# Patient Record
Sex: Female | Born: 1955 | Race: White | Hispanic: No | Marital: Married | Smoking: Never smoker
Health system: Southern US, Community
[De-identification: ages and names within clinical notes are randomized; demographics above are authoritative.]

## PROBLEM LIST (undated history)

## (undated) DIAGNOSIS — Z86018 Personal history of other benign neoplasm: Secondary | ICD-10-CM

## (undated) DIAGNOSIS — J40 Bronchitis, not specified as acute or chronic: Secondary | ICD-10-CM

## (undated) DIAGNOSIS — S92909A Unspecified fracture of unspecified foot, initial encounter for closed fracture: Secondary | ICD-10-CM

## (undated) DIAGNOSIS — M810 Age-related osteoporosis without current pathological fracture: Secondary | ICD-10-CM

## (undated) DIAGNOSIS — K829 Disease of gallbladder, unspecified: Secondary | ICD-10-CM

## (undated) DIAGNOSIS — R87610 Atypical squamous cells of undetermined significance on cytologic smear of cervix (ASC-US): Secondary | ICD-10-CM

## (undated) DIAGNOSIS — R1011 Right upper quadrant pain: Secondary | ICD-10-CM

## (undated) DIAGNOSIS — G4730 Sleep apnea, unspecified: Secondary | ICD-10-CM

## (undated) DIAGNOSIS — G4733 Obstructive sleep apnea (adult) (pediatric): Secondary | ICD-10-CM

## (undated) DIAGNOSIS — M26609 Unspecified temporomandibular joint disorder, unspecified side: Secondary | ICD-10-CM

## (undated) DIAGNOSIS — O24419 Gestational diabetes mellitus in pregnancy, unspecified control: Secondary | ICD-10-CM

## (undated) DIAGNOSIS — C4491 Basal cell carcinoma of skin, unspecified: Secondary | ICD-10-CM

## (undated) DIAGNOSIS — N6019 Diffuse cystic mastopathy of unspecified breast: Secondary | ICD-10-CM

## (undated) DIAGNOSIS — M2550 Pain in unspecified joint: Secondary | ICD-10-CM

## (undated) DIAGNOSIS — F419 Anxiety disorder, unspecified: Secondary | ICD-10-CM

## (undated) DIAGNOSIS — J309 Allergic rhinitis, unspecified: Secondary | ICD-10-CM

## (undated) DIAGNOSIS — T7840XA Allergy, unspecified, initial encounter: Secondary | ICD-10-CM

## (undated) HISTORY — DX: Allergic rhinitis, unspecified: J30.9

## (undated) HISTORY — PX: LIPOMA EXCISION: SHX5283

## (undated) HISTORY — DX: Obstructive sleep apnea (adult) (pediatric): G47.33

## (undated) HISTORY — DX: Basal cell carcinoma of skin, unspecified: C44.91

## (undated) HISTORY — DX: Personal history of other benign neoplasm: Z86.018

## (undated) HISTORY — DX: Sleep apnea, unspecified: G47.30

## (undated) HISTORY — DX: Allergy, unspecified, initial encounter: T78.40XA

## (undated) HISTORY — PX: OTHER SURGICAL HISTORY: SHX169

## (undated) HISTORY — DX: Diffuse cystic mastopathy of unspecified breast: N60.19

## (undated) HISTORY — DX: Disease of gallbladder, unspecified: K82.9

## (undated) HISTORY — DX: Unspecified fracture of unspecified foot, initial encounter for closed fracture: S92.909A

## (undated) HISTORY — DX: Gestational diabetes mellitus in pregnancy, unspecified control: O24.419

## (undated) HISTORY — DX: Anxiety disorder, unspecified: F41.9

## (undated) HISTORY — DX: Unspecified temporomandibular joint disorder, unspecified side: M26.609

## (undated) HISTORY — PX: BREAST CYST ASPIRATION: SHX578

## (undated) HISTORY — DX: Age-related osteoporosis without current pathological fracture: M81.0

## (undated) HISTORY — DX: Atypical squamous cells of undetermined significance on cytologic smear of cervix (ASC-US): R87.610

---

## 1994-01-18 HISTORY — PX: OTHER SURGICAL HISTORY: SHX169

## 2003-10-21 ENCOUNTER — Ambulatory Visit: Payer: Self-pay | Admitting: Obstetrics and Gynecology

## 2004-04-08 ENCOUNTER — Ambulatory Visit: Payer: Self-pay | Admitting: Obstetrics and Gynecology

## 2004-04-27 ENCOUNTER — Other Ambulatory Visit: Admission: RE | Admit: 2004-04-27 | Discharge: 2004-04-27 | Payer: Self-pay | Admitting: Diagnostic Radiology

## 2004-04-27 ENCOUNTER — Encounter (INDEPENDENT_AMBULATORY_CARE_PROVIDER_SITE_OTHER): Payer: Self-pay | Admitting: *Deleted

## 2004-04-27 ENCOUNTER — Encounter: Admission: RE | Admit: 2004-04-27 | Discharge: 2004-04-27 | Payer: Self-pay | Admitting: *Deleted

## 2004-07-14 ENCOUNTER — Ambulatory Visit: Payer: Self-pay | Admitting: Family Medicine

## 2004-07-16 ENCOUNTER — Ambulatory Visit: Payer: Self-pay | Admitting: Family Medicine

## 2004-09-08 ENCOUNTER — Ambulatory Visit: Payer: Self-pay | Admitting: Family Medicine

## 2004-09-08 ENCOUNTER — Other Ambulatory Visit: Admission: RE | Admit: 2004-09-08 | Discharge: 2004-09-08 | Payer: Self-pay | Admitting: Family Medicine

## 2004-09-09 ENCOUNTER — Encounter: Payer: Self-pay | Admitting: Family Medicine

## 2005-05-31 ENCOUNTER — Ambulatory Visit: Payer: Self-pay | Admitting: Family Medicine

## 2005-06-07 ENCOUNTER — Ambulatory Visit: Payer: Self-pay | Admitting: Family Medicine

## 2005-06-08 ENCOUNTER — Ambulatory Visit: Payer: Self-pay | Admitting: Family Medicine

## 2005-11-30 ENCOUNTER — Ambulatory Visit: Payer: Self-pay | Admitting: Family Medicine

## 2006-05-03 ENCOUNTER — Encounter: Payer: Self-pay | Admitting: Family Medicine

## 2006-06-13 ENCOUNTER — Ambulatory Visit: Payer: Self-pay | Admitting: Obstetrics and Gynecology

## 2006-12-22 ENCOUNTER — Encounter: Payer: Self-pay | Admitting: Family Medicine

## 2006-12-22 DIAGNOSIS — N6019 Diffuse cystic mastopathy of unspecified breast: Secondary | ICD-10-CM | POA: Insufficient documentation

## 2006-12-22 DIAGNOSIS — J309 Allergic rhinitis, unspecified: Secondary | ICD-10-CM | POA: Insufficient documentation

## 2006-12-30 ENCOUNTER — Encounter: Payer: Self-pay | Admitting: Family Medicine

## 2006-12-30 ENCOUNTER — Other Ambulatory Visit: Admission: RE | Admit: 2006-12-30 | Discharge: 2006-12-30 | Payer: Self-pay | Admitting: Family Medicine

## 2006-12-30 ENCOUNTER — Ambulatory Visit: Payer: Self-pay | Admitting: Family Medicine

## 2006-12-31 ENCOUNTER — Encounter: Payer: Self-pay | Admitting: Family Medicine

## 2007-01-02 LAB — CONVERTED CEMR LAB
ALT: 12 units/L (ref 0–35)
AST: 13 units/L (ref 0–37)
Basophils Absolute: 0.1 10*3/uL (ref 0.0–0.1)
Basophils Relative: 1 % (ref 0–1)
Bilirubin, Direct: <0.1 mg/dL (ref 0.0–0.3)
CO2: 26 meq/L (ref 19–32)
Chloride: 104 meq/L (ref 96–112)
Glucose, Bld: 92 mg/dL (ref 70–99)
Lymphocytes Relative: 29 % (ref 12–46)
MCHC: 32.7 g/dL (ref 30.0–36.0)
Neutro Abs: 3 10*3/uL (ref 1.7–7.7)
Neutrophils Relative %: 60 % (ref 43–77)
RDW: 12.6 % (ref 11.5–15.5)
Sodium: 140 meq/L (ref 135–145)
TSH: 1.554 microintl units/mL (ref 0.350–5.50)
Total Protein: 7.3 g/dL (ref 6.0–8.3)

## 2007-01-03 ENCOUNTER — Encounter: Payer: Self-pay | Admitting: Family Medicine

## 2007-01-03 ENCOUNTER — Ambulatory Visit: Payer: Self-pay | Admitting: Family Medicine

## 2007-01-10 ENCOUNTER — Encounter (INDEPENDENT_AMBULATORY_CARE_PROVIDER_SITE_OTHER): Payer: Self-pay | Admitting: *Deleted

## 2007-01-10 LAB — CONVERTED CEMR LAB: Pap Smear: NORMAL

## 2007-05-10 ENCOUNTER — Ambulatory Visit: Payer: Self-pay | Admitting: Family Medicine

## 2007-05-10 DIAGNOSIS — N926 Irregular menstruation, unspecified: Secondary | ICD-10-CM | POA: Insufficient documentation

## 2007-05-16 ENCOUNTER — Encounter: Admission: RE | Admit: 2007-05-16 | Discharge: 2007-05-16 | Payer: Self-pay | Admitting: Family Medicine

## 2007-06-14 ENCOUNTER — Encounter: Payer: Self-pay | Admitting: Family Medicine

## 2007-06-14 ENCOUNTER — Ambulatory Visit: Payer: Self-pay | Admitting: Family Medicine

## 2007-06-19 ENCOUNTER — Encounter (INDEPENDENT_AMBULATORY_CARE_PROVIDER_SITE_OTHER): Payer: Self-pay | Admitting: *Deleted

## 2007-12-05 ENCOUNTER — Encounter: Payer: Self-pay | Admitting: Family Medicine

## 2008-02-21 ENCOUNTER — Ambulatory Visit: Payer: Self-pay | Admitting: Family Medicine

## 2008-02-21 ENCOUNTER — Encounter: Payer: Self-pay | Admitting: Family Medicine

## 2008-02-21 ENCOUNTER — Other Ambulatory Visit: Admission: RE | Admit: 2008-02-21 | Discharge: 2008-02-21 | Payer: Self-pay | Admitting: Family Medicine

## 2008-02-26 ENCOUNTER — Encounter (INDEPENDENT_AMBULATORY_CARE_PROVIDER_SITE_OTHER): Payer: Self-pay | Admitting: *Deleted

## 2008-03-05 ENCOUNTER — Ambulatory Visit: Payer: Self-pay | Admitting: Internal Medicine

## 2008-04-08 ENCOUNTER — Ambulatory Visit: Payer: Self-pay | Admitting: Internal Medicine

## 2008-04-08 HISTORY — PX: COLONOSCOPY: SHX174

## 2008-04-08 LAB — HM COLONOSCOPY

## 2008-08-13 ENCOUNTER — Encounter: Payer: Self-pay | Admitting: Family Medicine

## 2008-08-13 ENCOUNTER — Ambulatory Visit: Payer: Self-pay | Admitting: Family Medicine

## 2008-08-15 ENCOUNTER — Encounter (INDEPENDENT_AMBULATORY_CARE_PROVIDER_SITE_OTHER): Payer: Self-pay | Admitting: *Deleted

## 2008-12-20 ENCOUNTER — Ambulatory Visit: Payer: Self-pay | Admitting: Family Medicine

## 2008-12-23 ENCOUNTER — Encounter: Payer: Self-pay | Admitting: Family Medicine

## 2009-07-31 ENCOUNTER — Ambulatory Visit: Payer: Self-pay | Admitting: Family Medicine

## 2009-07-31 LAB — CONVERTED CEMR LAB
Casts: 0 /lpf
Glucose, Urine, Semiquant: NEGATIVE
Nitrite: NEGATIVE
Protein, U semiquant: NEGATIVE
Urobilinogen, UA: 0.2
WBC Urine, dipstick: NEGATIVE
Yeast, UA: 0
pH: 6

## 2009-08-14 ENCOUNTER — Ambulatory Visit: Payer: Self-pay | Admitting: Family Medicine

## 2009-08-14 ENCOUNTER — Encounter: Payer: Self-pay | Admitting: Family Medicine

## 2009-08-18 ENCOUNTER — Encounter: Payer: Self-pay | Admitting: Family Medicine

## 2009-08-18 ENCOUNTER — Encounter (INDEPENDENT_AMBULATORY_CARE_PROVIDER_SITE_OTHER): Payer: Self-pay | Admitting: *Deleted

## 2009-10-07 ENCOUNTER — Other Ambulatory Visit: Admission: RE | Admit: 2009-10-07 | Discharge: 2009-10-07 | Payer: Self-pay | Admitting: Family Medicine

## 2009-10-07 ENCOUNTER — Ambulatory Visit: Payer: Self-pay | Admitting: Family Medicine

## 2009-10-07 LAB — HM PAP SMEAR

## 2009-10-16 ENCOUNTER — Encounter: Admission: RE | Admit: 2009-10-16 | Discharge: 2009-10-16 | Payer: Self-pay | Admitting: Family Medicine

## 2009-10-16 LAB — HM MAMMOGRAPHY

## 2009-10-20 DIAGNOSIS — R87610 Atypical squamous cells of undetermined significance on cytologic smear of cervix (ASC-US): Secondary | ICD-10-CM | POA: Insufficient documentation

## 2009-10-20 LAB — CONVERTED CEMR LAB: Pap Smear: UNDETERMINED

## 2009-10-30 ENCOUNTER — Encounter: Payer: Self-pay | Admitting: Family Medicine

## 2010-02-15 LAB — CONVERTED CEMR LAB
Calcium: 9.3 mg/dL (ref 8.4–10.5)
Ketones, urine, test strip: NEGATIVE
Lymphocytes Relative: 27 % (ref 12.0–46.0)
MCHC: 33.9 g/dL (ref 30.0–36.0)
Monocytes Relative: 4.4 % (ref 3.0–12.0)
Neutro Abs: 3.7 10*3/uL (ref 1.4–7.7)
Neutrophils Relative %: 64.8 % (ref 43.0–77.0)
Platelets: 238 10*3/uL (ref 150–400)
RBC: 4.34 M/uL (ref 3.87–5.11)
RDW: 12.2 % (ref 11.5–14.6)
TSH: 0.86 microintl units/mL (ref 0.35–5.50)
Urobilinogen, UA: 0.2

## 2010-02-17 NOTE — Letter (Signed)
Summary: Health Assessment/Living Well  Health Assessment/Living Well   Imported By: Lanelle Bal 10/15/2009 10:03:06  _____________________________________________________________________  External Attachment:    Type:   Image     Comment:   External Document

## 2010-02-17 NOTE — Miscellaneous (Signed)
  Clinical Lists Changes  Observations: Added new observation of MAMMO DUE: 08/15/2010 (08/14/2009 12:46) Added new observation of MAMMOGRAM: Normal (08/14/2009 12:46)

## 2010-02-17 NOTE — Assessment & Plan Note (Signed)
Summary: CPX/CLE   Vital Signs:  Patient profile:   55 year old female Height:      65 inches Weight:      156.75 pounds BMI:     26.18 Temp:     98.2 degrees F oral Pulse rate:   68 / minute Pulse rhythm:   regular BP sitting:   110 / 64  (left arm) Cuff size:   regular  Vitals Entered By: Lewanda Rife LPN (October 07, 2009 2:39 PM) CC: CPX with pap and breast exam LMP 08/31/09   History of Present Illness: here for wellness exam and to review chronic health problems  is feeling well and doing well   wt is stable   bp good 110/64  remote hx of gestational DM and anx   had labs last december at work and will have it done again in nov  sugar is 88 and nl comp met and chol trig 76, HDL 69, and LDL 68  overall all labs look good  eats fairly healthy and exercises -- likes to go to the gym and use elliptical and some weights  walks every day at work   pap 2/10 hx of cryo cervix in 96-- no abn paps since  used to see Dr Toya Smothers  no bleeding or problems or pain lately  26 weeks without period and then one in august  has been a bit unpredictable  none in 5 weeks and no chance pregnant    mam 7/11 self exam-- has a big lump in L breast under the nipple for 2 months  hx of fibrocystic change this is starting to bother her given size- thinks it is a cyst    Td 04  flu shot -will get at work free   derm f/u -- goes for visit in a few weeks with annual screen   3/10 nl colonosc - rec 10 y f/u  aleve really helped her knee -- takes aleve 1 pill two times a day maximum -- really helps pain and does not hurt stomach or blood pressure    Allergies: 1)  Penicillin  Past History:  Past Surgical History: Last updated: 04/14/2008 Breast cysts, benign- aspirated Lipoma removed from back Cryo surgery on cervix (1996) Basal cell on face Pelvic US- some free fluid, tiny fibroids breast cysts 6/08 with [place] and neg mam (and neg aspiration)- Dr Epifania Gore- Lorre Munroe abd  [place] - normal 4/09 3/10 colonoscopy neg- re check 10y  Family History: Last updated: 07/31/2009 Father: DM 2, HTN, bladder cancer Mother: HTN Siblings:  MGM breast cancer- ? possibly female cancer  No FH of Colon Cancer: Family History of Colon Polyps: mother (elderly)  Social History: Last updated: 03/05/2008 Marital Status: Married husband with systemic autoimmune disease, works in [employer] de[employer] Children: 2 Occupation: Armed forces operational officer non smoker  Alcohol Use - yes  1 per day  Risk Factors: Smoking Status: never (12/22/2006)  Past Medical History: Allergic rhinitis fibrocystic breasts gestational DM derm- Jarold Motto (pt goes for yearly checks) Anxiety Disorder        derm (Dr Jarold Motto retired)   Review of Systems General:  Denies chills, fatigue, fever, and loss of appetite. Eyes:  Denies blurring and eye irritation. CV:  Denies chest pain or discomfort and palpitations. Resp:  Denies cough and shortness of breath. GI:  Denies abdominal pain, change in bowel habits, indigestion, nausea, and vomiting. GU:  Denies abnormal vaginal bleeding, discharge, dysuria, nocturia, and urinary frequency. MS:  Denies joint  pain, joint redness, and joint swelling. Derm:  Denies itching, lesion(s), poor wound healing, and rash. Neuro:  Denies numbness and tingling. Psych:  Denies anxiety and depression. Endo:  Denies cold intolerance, excessive thirst, excessive urination, and heat intolerance. Heme:  Denies abnormal bruising and bleeding.  Physical Exam  General:  Well-developed,well-nourished,in no acute distress; alert,appropriate and cooperative throughout examination Head:  normocephalic, atraumatic, and no abnormalities observed.   Eyes:  vision grossly intact, pupils equal, pupils round, and pupils reactive to light.  no conjunctival pallor, injection or icterus  Ears:  R ear normal and L ear normal.   Nose:  no nasal discharge.   Mouth:  pharynx pink and moist.     Neck:  supple with full rom and no masses or thyromegally, no JVD or carotid bruit  Chest Wall:  No deformities, masses, or tenderness noted. Breasts:  R breast dense L breast 2 cm mass under nipple that is smooth and mobile and slt tender Lungs:  Normal respiratory effort, chest expands symmetrically. Lungs are clear to auscultation, no crackles or wheezes. Heart:  Normal rate and regular rhythm. S1 and S2 normal without gallop, murmur, click, rub or other extra sounds. Abdomen:  Bowel sounds positive,abdomen soft and non-tender without masses, organomegaly or hernias noted. no renal bruits  Genitalia:  Normal introitus for age, no external lesions, no vaginal discharge, mucosa pink and moist, no vaginal or cervical lesions, no vaginal atrophy, no friaility or hemorrhage, normal uterus size and position, no adnexal masses or tenderness Msk:  No deformity or scoliosis noted of thoracic or lumbar spine.  no acute joint changes  Pulses:  R and L carotid,radial,femoral,dorsalis pedis and posterior tibial pulses are full and equal bilaterally Extremities:  No clubbing, cyanosis, edema, or deformity noted with normal full range of motion of all joints.   Neurologic:  sensation intact to light touch, gait normal, and DTRs symmetrical and normal.   Skin:  Intact without suspicious lesions or rashes Cervical Nodes:  No lymphadenopathy noted Axillary Nodes:  No palpable lymphadenopathy Inguinal Nodes:  No significant adenopathy Psych:  normal affect, talkative and pleasant    Impression & Recommendations:  Problem # 1:  HEALTH MAINTENANCE EXAM (ICD-V70.0) Assessment Comment Only reviewed health habits including diet, exercise and skin cancer prevention reviewed health maintenance list and family history rev last labs - good  will send upcoming work labs from november   Problem # 2:  ROUTINE GYNECOLOGICAL EXAMINATION (ICD-V72.31) Assessment: Comment Only annual exam with remote hx of cervical  dysplasia and cryo  some irregular menses - 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 Take one capsule by mouth twice daily. 2)  Calcium Carbonate 600mg   .... Once daily 3)  Aleve 220 Mg Tabs (Naproxen sodium) .... Otc as directed.  Patient Instructions: 1)  don't forget to get your flu shot at work  2)  Armond Hang is ok with food if it does not bother your stomach 3)  follow up with dermatology as planned 4)  keep working on healthy diet and exercise  5)  we will do breast ultrasound referral at check out   Current Allergies (reviewed today):  PENICILLIN    Prevention & Chronic Care Immunizations   Influenza vaccine: given  (10/28/2006)    Tetanus booster: 01/18/2002: Td    Pneumococcal vaccine: Not documented  Colorectal Screening   Hemoccult: Negative  (10/19/2003)    Colonoscopy: Location:  [facility].    (04/08/2008)   Colonoscopy due: 04/2018  Other Screening   Pap smear: NEGATIVE FOR INTRAEPITHELIAL LESIONS OR MALIGNANCY.  (02/21/2008)    Mammogram: Normal  (08/14/2009)   Mammogram action/deferral: Screening mammogram in 1 year.     (08/13/2008)   Mammogram due: 08/15/2010   Smoking status: never  (12/22/2006)  Lipids   Total Cholesterol: Not documented   LDL: Not documented   LDL Direct: Not documented   HDL: Not documented   Triglycerides: Not documented

## 2010-02-17 NOTE — Consult Note (Signed)
Summary: [facility]   Imported By: Lanelle Bal 11/12/2009 13:33:06  _____________________________________________________________________  External Attachment:    Type:   Image     Comment:   External Document

## 2010-02-17 NOTE — Assessment & Plan Note (Signed)
Summary: ACUTE, PAIN IN RIGHT  OVARY AREA  CYD   Vital Signs:  Patient profile:   55 year old female Height:      65 inches Weight:      156 pounds BMI:     26.05 Temp:     98 degrees F oral Pulse rate:   68 / minute Pulse rhythm:   regular BP sitting:   106 / 64  (left arm) Cuff size:   regular  Vitals Entered By: Lewanda Rife LPN (July 31, 2009 11:44 AM) CC: sharp pain in lower rt side on and off  on 07/28/09. Then no pain until 07/30/09 at 2pm. No pain since then.   History of Present Illness: woks up monday am with sharp pains in R pelvic area came and went -- then later that am and early afternoon  went away for a while - then came back early afternoon yest now gone again   was intermittent  pressed on it -- and was gone quickly  has had ovarian cysts in the past   had her last period in january and none since  that one came early  feels fine  no hot flashes / sleeping is ok  no mood changes or other menopause symptoms except concentration problems  has to be dilligent about writing things down  no urine symptoms - did a sample   a little nausea monday - now fine   no stool changes  no diet change -- is eating more fresh veggies  at beach last week   has PE coming up in sept   Allergies: 1)  Penicillin  Past History:  Past Medical History: Last updated: 03/05/2008 Allergic rhinitis fibrocystic breasts gestational DM derm- Jarold Motto (pt goes for yearly checks) Anxiety Disorder  Past Surgical History: Last updated: 04/14/2008 Breast cysts, benign- aspirated Lipoma removed from back Cryo surgery on cervix (1996) Basal cell on face Pelvic US- some free fluid, tiny fibroids breast cysts 6/08 with [place] and neg mam (and neg aspiration)- Dr Epifania Gore- Lorre Munroe abd [place] - normal 4/09 3/10 colonoscopy neg- re check 10y  Family History: Last updated: 07/31/2009 Father: DM 2, HTN, bladder cancer Mother: HTN Siblings:  MGM breast cancer- ? possibly female  cancer  No FH of Colon Cancer: Family History of Colon Polyps: mother (elderly)  Social History: Last updated: 03/05/2008 Marital Status: Married husband with systemic autoimmune disease, works in [employer] de[employer] Children: 2 Occupation: Armed forces operational officer non smoker  Alcohol Use - yes  1 per day  Risk Factors: Smoking Status: never (12/22/2006)  Family History: Father: DM 2, HTN, bladder cancer Mother: HTN Siblings:  MGM breast cancer- ? possibly female cancer  No FH of Colon Cancer: Family History of Colon Polyps: mother (elderly)  Review of Systems General:  Denies fatigue, fever, loss of appetite, and malaise. Eyes:  Denies blurring and eye irritation. CV:  Denies chest pain or discomfort and palpitations. Resp:  Denies cough, shortness of breath, and wheezing. GI:  Denies abdominal pain, change in bowel habits, and indigestion. GU:  Denies abnormal vaginal bleeding, discharge, dysuria, hematuria, urinary frequency, and urinary hesitancy. MS:  Denies joint pain, low back pain, mid back pain, and muscle weakness. Derm:  Denies lesion(s), poor wound healing, and rash. Neuro:  Denies numbness and tingling. Psych:  Denies anxiety and depression. Endo:  Denies cold intolerance and heat intolerance. Heme:  Denies abnormal bruising and bleeding.  Physical Exam  General:  Well-developed,well-nourished,in no acute distress; alert,appropriate and cooperative  throughout examination Head:  normocephalic, atraumatic, and no abnormalities observed.   Eyes:  vision grossly intact, pupils equal, pupils round, and pupils reactive to light.  no conjunctival pallor, injection or icterus  Mouth:  pharynx pink and moist.   Neck:  No deformities, masses, or tenderness noted. Lungs:  Normal respiratory effort, chest expands symmetrically. Lungs are clear to auscultation, no crackles or wheezes. Heart:  Normal rate and regular rhythm. S1 and S2 normal without gallop, murmur, click, rub or other  extra sounds. Abdomen:  Bowel sounds positive,abdomen soft and non-tender without masses, organomegaly or hernias noted. no suprapubic tenderness or fullness felt  Msk:  no CVA tenderness no LS tenderness  Pulses:  R and L carotid,radial,femoral,dorsalis pedis and posterior tibial pulses are full and equal bilaterally Extremities:  No clubbing, cyanosis, edema, or deformity noted with normal full range of motion of all joints.   Neurologic:  sensation intact to light touch, gait normal, and DTRs symmetrical and normal.   Skin:  Intact without suspicious lesions or rashes Cervical Nodes:  No lymphadenopathy noted Inguinal Nodes:  No significant adenopathy Psych:  normal affect, talkative and pleasant    Impression & Recommendations:  Problem # 1:  PELVIC PAIN, RIGHT (ICD-789.09)  brief and intermittent and now resolved neg urine diff includes ov cyst (has had in past ) , start of late menses/ fibroids or less likely kidney stone (neg ua) decided to update me if pain returns and will get [place]  f/u sept for PE / GYN also   Orders: UA Dipstick W/ Micro (manual) ([id number])  Complete Medication List: 1)  Glucosamine-chondroitin Caps (Glucosamine-chondroit-vit c-mn) .... Take one capsule by mouth twice daily. 2)  Multivitamins Tabs (Multiple vitamin) .... Once daily 3)  Calcium Carbonate 600mg   .... Once daily  Other Orders: Radiology Referral (Radiology)  Patient Instructions: 1)  please update me if your symptoms return  and I will order an ultrasound 2)  also update me if any urine symptoms/ fever or anything else 3)  will check in when you return for your physical  4)  we will schedule mammogram at check out   Current Allergies (reviewed today): PENICILLIN  Laboratory Results   Urine Tests  Date/Time Received: July 31, 2009 11:47 AM  Date/Time Reported: July 31, 2009 11:47 AM   Routine Urinalysis   Color: yellow Appearance: slightly hazy Glucose: negative   (Normal  Range: Negative) Bilirubin: negative   (Normal Range: Negative) Ketone: negative   (Normal Range: Negative) Spec. Gravity: 1.010   (Normal Range: 1.003-1.035) Blood: trace-lysed   (Normal Range: Negative) pH: 6.0   (Normal Range: 5.0-8.0) Protein: negative   (Normal Range: Negative) Urobilinogen: 0.2   (Normal Range: 0-1) Nitrite: negative   (Normal Range: Negative) Leukocyte Esterace: negative   (Normal Range: Negative)  Urine Microscopic WBC/HPF: 0 RBC/HPF: 0 Bacteria/HPF: 0 Mucous/HPF: few Epithelial/HPF: 0-1 Crystals/HPF: 0 Casts/LPF: 0 Yeast/HPF: 0 Other: 0

## 2010-02-17 NOTE — Letter (Signed)
Summary: Historic Patient File  Historic Patient File   Imported By: Beau Fanny 01/31/2007 10:24:58  _____________________________________________________________________  External Attachment:    Type:   Image     Comment:   External Document

## 2010-02-17 NOTE — Letter (Signed)
Summary: Results Follow up Letter  [facility]  [address] [place], [place] [address]   Phone: [phone]  Fax: [phone]    08/18/2009 MRN: [id number]    [facility] [address] [place], [place]  [address]    Dear Ms. Jaggers,  The following are the results of your recent test(s):  Test         Result    Pap Smear:        Normal _____  Not Normal _____ Comments: ______________________________________________________ Cholesterol: LDL(Bad cholesterol):         Your goal is less than:         HDL (Good cholesterol):       Your goal is more than: Comments:  ______________________________________________________ Mammogram:        Normal __X___  Not Normal _____ Comments:  Yearly follow up is recommended.   ___________________________________________________________________ Hemoccult:        Normal _____  Not normal _______ Comments:    _____________________________________________________________________ Other Tests:    We routinely do not discuss normal results over the telephone.  If you desire a copy of the results, or you have any questions about this information we can discuss them at your next office visit.   Sincerely,    Marne A. Milinda Antis, M.D.  MAT:lsf

## 2010-03-02 ENCOUNTER — Other Ambulatory Visit: Payer: Self-pay | Admitting: Family Medicine

## 2010-03-02 ENCOUNTER — Encounter: Payer: Self-pay | Admitting: Family Medicine

## 2010-03-02 ENCOUNTER — Ambulatory Visit (INDEPENDENT_AMBULATORY_CARE_PROVIDER_SITE_OTHER): Payer: BC Managed Care – PPO | Admitting: Family Medicine

## 2010-03-02 DIAGNOSIS — N6019 Diffuse cystic mastopathy of unspecified breast: Secondary | ICD-10-CM

## 2010-03-02 DIAGNOSIS — N63 Unspecified lump in unspecified breast: Secondary | ICD-10-CM

## 2010-03-02 DIAGNOSIS — N6313 Unspecified lump in the right breast, lower outer quadrant: Secondary | ICD-10-CM

## 2010-03-09 ENCOUNTER — Ambulatory Visit
Admission: RE | Admit: 2010-03-09 | Discharge: 2010-03-09 | Disposition: A | Discharge status: Home / Self Care | Payer: BC Managed Care – PPO | Source: Ambulatory Visit | Attending: Family Medicine | Admitting: Family Medicine

## 2010-03-09 ENCOUNTER — Other Ambulatory Visit: Payer: Self-pay | Admitting: Family Medicine

## 2010-03-09 ENCOUNTER — Other Ambulatory Visit: Payer: BC Managed Care – PPO

## 2010-03-09 DIAGNOSIS — N6313 Unspecified lump in the right breast, lower outer quadrant: Secondary | ICD-10-CM

## 2010-03-10 ENCOUNTER — Other Ambulatory Visit: Payer: Self-pay | Admitting: Family Medicine

## 2010-03-10 DIAGNOSIS — N6310 Unspecified lump in the right breast, unspecified quadrant: Secondary | ICD-10-CM

## 2010-03-17 NOTE — Assessment & Plan Note (Signed)
Summary: LUMP IN RIGHT BREAST   Vital Signs:  Patient profile:   55 year old female Height:      65 inches Weight:      156 pounds BMI:     26.05 Temp:     98.5 degrees F oral Pulse rate:   64 / minute Pulse rhythm:   regular BP sitting:   128 / 80  (left arm) Cuff size:   regular  Vitals Entered By: Lewanda Rife LPN (March 02, 2010 12:02 PM) CC: Lump at 6-7 o clock position on rt breast   History of Present Illness: here for breast lump 6-7 :00 in R  found it last week feels a little different- more firm  under breast  not tender is movable  no skin color change - but itches a bit  no nipple d/c   last aspiration of cyst was L breast 9/11  due for screen mammogram in july  Allergies: 1)  Penicillin  Past History:  Past Surgical History: Last updated: 10/16/2009 Breast cysts, benign- aspirated Lipoma removed from back Cryo surgery on cervix (1996) Basal cell on face Pelvic US- some free fluid, tiny fibroids breast cysts 6/08 with [place] and neg mam (and neg aspiration)- Dr Epifania GoreLorre Munroe abd [place] - normal 4/09 3/10 colonoscopy neg- re check 10y 09/11 breast cyst aspiration  Family History: Last updated: 07/31/2009 Father: DM 2, HTN, bladder cancer Mother: HTN Siblings:  MGM breast cancer- ? possibly female cancer  No FH of Colon Cancer: Family History of Colon Polyps: mother (elderly)  Social History: Last updated: 03/05/2008 Marital Status: Married husband with systemic autoimmune disease, works in [employer] de[employer] Children: 2 Occupation: Armed forces operational officer non smoker  Alcohol Use - yes  1 per day  Risk Factors: Smoking Status: never (12/22/2006)  Past Medical History: Allergic rhinitis fibrocystic breasts (recurrent breast cysts with aspiration) gestational DM derm- Jarold Motto (pt goes for yearly checks) Anxiety Disorder         derm (Dr Jarold Motto retired)   Review of Systems General:  Denies chills, fatigue, fever, and loss of  appetite. Eyes:  Denies blurring. CV:  Denies chest pain or discomfort and lightheadness. Resp:  Denies cough and wheezing. GU:  Denies discharge and dysuria. MS:  Denies joint pain. Derm:  Complains of itching; denies lesion(s), poor wound healing, and rash. Heme:  Denies abnormal bruising and bleeding.  Physical Exam  General:  Well-developed,well-nourished,in no acute distress; alert,appropriate and cooperative throughout examination Mouth:  pharynx pink and moist.   Neck:  supple with full rom and no masses or thyromegally, no JVD or carotid bruit  Breasts:  generally lumpy and fibrocystic breasts R breast at 7:00 2-3 cm oval mobile M that is smooth and nt  no skin change or nipple d/c Lungs:  Normal respiratory effort, chest expands symmetrically. Lungs are clear to auscultation, no crackles or wheezes. Heart:  Normal rate and regular rhythm. S1 and S2 normal without gallop, murmur, click, rub or other extra sounds. Skin:  Intact without suspicious lesions or rashes Cervical Nodes:  No lymphadenopathy noted Axillary Nodes:  No palpable lymphadenopathy Psych:  normal affect, talkative and pleasant    Impression & Recommendations:  Problem # 1:  BREAST MASS, RIGHT (ICD-611.72) Assessment New suspect cyst in pt with recurrent ones ref for [place] and mam  not bothersome per pt enough to aspirate at this time disc elim of caffine for fibrocystic breasts  Orders: Radiology Referral (Radiology)  Patient Instructions: 1)  we will  set you up for ultrasound of breast at check out    Orders Added: 1)  Radiology Referral [Radiology] 2)  Est. Patient Level III [95188]    Prior Medications (reviewed today): None Current Allergies (reviewed today): PENICILLIN

## 2010-04-15 ENCOUNTER — Encounter: Payer: Self-pay | Admitting: Family Medicine

## 2010-04-15 ENCOUNTER — Ambulatory Visit (INDEPENDENT_AMBULATORY_CARE_PROVIDER_SITE_OTHER): Payer: BC Managed Care – PPO | Admitting: Family Medicine

## 2010-04-15 VITALS — BP 120/74 | HR 72 | Temp 98.2°F | Wt 154.1 lb

## 2010-04-15 DIAGNOSIS — B309 Viral conjunctivitis, unspecified: Secondary | ICD-10-CM

## 2010-04-15 MED ORDER — POLYMYXIN B-TRIMETHOPRIM 10000-0.1 UNIT/ML-% OP SOLN: 1.0000 [drp] | Freq: Four times a day (QID) | OPHTHALMIC | Status: AC

## 2010-04-15 NOTE — Assessment & Plan Note (Signed)
Discussed anticipated course of illness. Discussed difference between viral and bacterial conjunctivitis. Discussed red flags to seek further care. Discussed care of eye as well as use of lubricating drop. Discussed wash hands and avoid contact with eye as contagious.

## 2010-04-15 NOTE — Patient Instructions (Signed)
Pink Eye (Conjunctivitis, Viral) Conjunctivitis is an irritation (inflammation) of the clear membrane that covers the white part of the eye (the conjunctiva). The irritation can also happen on the underside of the eyelids. Conjunctivitis makes the eye red or pink in color. This is what is commonly known as pink eye. Viral conjunctivitis can spread easily (contagious). CAUSES  Infection from virus on the surface of the eye.   Infection from the irritation or injury of nearby tissues such as the eyelids or cornea.   More serious inflammation or infection on the inside of the eye.   Other eye diseases.   The use of certain eye medications.  SYMPTOMS The normally white color of the eye or the underside of the eyelid is usually pink or red in color. The pink eye is usually associated with irritation, tearing and some sensitivity to light. Viral conjunctivitis is often associated with a clear, watery discharge. If a discharge is present, there may also be some blurred vision in the affected eye. DIAGNOSIS Conjunctivitis is diagnosed by an eye exam. The eye specialist looks for changes in the surface tissues of the eye which take on changes characteristic of the specific types of conjunctivitis. A sample of any discharge may be collected on a Q-Tip (sterile swap). The sample will be sent to a lab to see whether or not the inflammation is caused by bacterial or viral infection. TREATMENT Viral conjunctivitis will not respond to medicines that kill germs (antibiotics). Treatment is aimed at stopping a bacterial infection on top of the viral infection. The goal of treatment is to relieve symptoms (such as itching) with antihistamine drops or other eye medications.  HOME CARE INSTRUCTIONS  To ease discomfort, apply a cool, clean wash cloth to your eye for 10 to 20 minutes, 3 to 4 times a day.   Gently wipe away any drainage from the eye with a warm, wet washcloth or a cotton ball.   Wash your hands  often with soap and use paper towels to dry.   Do not share towels or washcloths. This may spread the infection.   Change or wash your pillowcase every day.   You should not use eye make-up until the infection is gone.   Stop using contacts lenses. Ask your eye professional how to sterilize or replace them before using again. This depends on the type of contact lenses used.   Do not touch the edge of the eyelid with the eye drop bottle or ointment tube when applying medications to the affected eye. This will stop you from spreading the infection to the other eye or to others.  SEEK IMMEDIATE MEDICAL CARE IF:  The infection has not improved within 3 days of beginning treatment.   A watery discharge from the eye develops.   Pain in the eye increases.   The redness is spreading.   Vision becomes blurred.   An oral temperature above 101 develops, or as your caregiver suggests.   Facial pain, redness or swelling develops.   Any problems that may be related to the prescribed medicine develop.  MAKE SURE YOU:   Understand these instructions.   Will watch your condition.   Will get help right away if you are not doing well or get worse.  Document Released: 01/04/2005 Document Re-Released: 12/18/2007 [facility] Patient Information 2011 [place], [place].  Good to see you today, call clinic with questions. Antibiotic drops to hold on to in case not improving as expected or worsening discharge. Warm compresses  to eye as well should help resolve quicker.

## 2010-04-15 NOTE — Progress Notes (Signed)
  Subjective:    Patient ID: Heather Mcdonald, female    DOB: May 12, 1955, 55 y.o.   MRN: [id number]  HPI CC: ? Pink eye  Woke up this morning with red around eye.  No blurry vision or vision changes.  No itching, not hurting significantly.  No pain with eye movements.  Not swelling more than normal.  H/o seasonal allergies.  Worse in August.  Not currently with flare issue.  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  No sick contacts at home.  Review of Systems Per HPI    Objective:   Physical Exam  Vitals reviewed. Constitutional: She appears well-developed and well-nourished. No distress.  HENT:  Head: Normocephalic and atraumatic.  Right Ear: External ear normal.  Left Ear: External ear normal.  Nose: Nose normal.  Mouth/Throat: Oropharynx is clear and moist. No oropharyngeal exudate.  Eyes: EOM and lids are normal. Pupils are equal, round, and reactive to light. Right eye exhibits no chemosis, no discharge, no exudate and no hordeolum. Left eye exhibits no chemosis, no discharge, no exudate and no hordeolum. Right conjunctiva is injected. Left conjunctiva is not injected. Left conjunctiva has no hemorrhage. No scleral icterus.       EOMI without pain, no orbital swelling. Injected bulbar conjunctiva with limbic sparing  Neck: Normal range of motion. Neck supple.  Lymphadenopathy:    She has no cervical adenopathy.          Assessment & Plan:

## 2010-09-22 ENCOUNTER — Other Ambulatory Visit: Payer: Self-pay | Admitting: Family Medicine

## 2010-09-22 DIAGNOSIS — Z1231 Encounter for screening mammogram for malignant neoplasm of breast: Secondary | ICD-10-CM

## 2010-10-07 ENCOUNTER — Ambulatory Visit
Admission: RE | Admit: 2010-10-07 | Discharge: 2010-10-07 | Disposition: A | Discharge status: Home / Self Care | Payer: BC Managed Care – PPO | Source: Ambulatory Visit | Attending: Family Medicine | Admitting: Family Medicine

## 2010-10-07 DIAGNOSIS — Z1231 Encounter for screening mammogram for malignant neoplasm of breast: Secondary | ICD-10-CM

## 2010-10-13 ENCOUNTER — Other Ambulatory Visit: Payer: Self-pay | Admitting: Family Medicine

## 2010-10-13 DIAGNOSIS — R928 Other abnormal and inconclusive findings on diagnostic imaging of breast: Secondary | ICD-10-CM

## 2010-10-22 ENCOUNTER — Other Ambulatory Visit: Payer: Self-pay | Admitting: Family Medicine

## 2010-10-22 ENCOUNTER — Ambulatory Visit
Admission: RE | Admit: 2010-10-22 | Discharge: 2010-10-22 | Disposition: A | Discharge status: Home / Self Care | Payer: BC Managed Care – PPO | Source: Ambulatory Visit | Attending: Family Medicine | Admitting: Family Medicine

## 2010-10-22 DIAGNOSIS — R928 Other abnormal and inconclusive findings on diagnostic imaging of breast: Secondary | ICD-10-CM

## 2010-11-25 ENCOUNTER — Ambulatory Visit (INDEPENDENT_AMBULATORY_CARE_PROVIDER_SITE_OTHER): Payer: BC Managed Care – PPO | Admitting: Family Medicine

## 2010-11-25 ENCOUNTER — Ambulatory Visit (INDEPENDENT_AMBULATORY_CARE_PROVIDER_SITE_OTHER)
Admission: RE | Admit: 2010-11-25 | Discharge: 2010-11-25 | Disposition: A | Discharge status: Home / Self Care | Payer: BC Managed Care – PPO | Source: Ambulatory Visit | Attending: Family Medicine | Admitting: Family Medicine

## 2010-11-25 ENCOUNTER — Encounter: Payer: Self-pay | Admitting: Family Medicine

## 2010-11-25 VITALS — BP 134/76 | HR 68 | Temp 98.0°F | Ht 65.0 in | Wt 157.2 lb

## 2010-11-25 DIAGNOSIS — R109 Unspecified abdominal pain: Secondary | ICD-10-CM

## 2010-11-25 DIAGNOSIS — R071 Chest pain on breathing: Secondary | ICD-10-CM

## 2010-11-25 DIAGNOSIS — R1011 Right upper quadrant pain: Secondary | ICD-10-CM

## 2010-11-25 DIAGNOSIS — R10A1 Flank pain, right side: Secondary | ICD-10-CM | POA: Insufficient documentation

## 2010-11-25 DIAGNOSIS — R0789 Other chest pain: Secondary | ICD-10-CM

## 2010-11-25 LAB — POCT UA - MICROSCOPIC ONLY
Casts, Ur, LPF, POC: 0
Crystals, Ur, HPF, POC: 0

## 2010-11-25 LAB — POCT URINALYSIS DIPSTICK
Nitrite, UA: NEGATIVE
Spec Grav, UA: 1.01
Urobilinogen, UA: 0.2
pH, UA: 6.5

## 2010-11-25 NOTE — Patient Instructions (Signed)
Urine is clear  Chest x ray today Please get me the lab report from yesterday  After reviewing all this I will make a plan - perhaps a GI referral if all is normal

## 2010-11-25 NOTE — Assessment & Plan Note (Addendum)
Ongoing and intermittent for 2 years  Rev abd [place] from 09- normal ua is neg today - do not suspect kidney stone  Is mild/ not positional/ and ? If trigger could be fatty foods  cxr today in light of some flank tenderness  Pt had full blood profile yesterday at [facility] and will be sending that If all nl - will consider GI ref (? Perhaps hida scan)

## 2010-11-25 NOTE — Assessment & Plan Note (Signed)
Ongoing - intermittent for 2 years with

## 2010-11-25 NOTE — Progress Notes (Signed)
Subjective:    Patient ID: Heather Mcdonald, female    DOB: 20-Mar-1955, 55 y.o.   MRN: [id number]  HPI Has R upper abdominal l pain under ribcage Comes and goes  ? Triggers -- bad with trip out of country- fried foods and alcohol Alcohol alone does not bother her  Was bad yesterday  Crampy/ nagging/ dull pain , no burning / no rash  Some gurgling  Yesterday was tender to the touch  No nausea or vomiting  Sometimes feels full  Pain sometimes radiates to back (not shoulder)  Notices it a lot when she drives a long distance   Has never had a kidney stone  Nl colonosc in past  Nl abd [place] in past    Ate hot dog 2 d ago  Yesterday -fruit and soup- was stressed yesterday (health screen bp was high)   Drinks a cup of coffee every day- does not bother her -- 1/2 caff   Takes 1 aleve at night -- due to chronic knee pain  When she takes too much of that - pain in L abdomen 05/16/07  No urinary symptoms or blood in urine  Patient Active Problem List  Diagnoses  . ALLERGIC RHINITIS  . FIBROCYSTIC BREAST DISEASE  . IRREGULAR MENSES  . ASCUS PAP  . BREAST MASS, RIGHT  . Viral conjunctivitis   Past Medical History  Diagnosis Date  . Allergic rhinitis, cause unspecified   . Papanicolaou smear of cervix with atypical squamous cells of undetermined significance (ASC-US)   . Lump or mass in breast   . Diffuse cystic mastopathy   . Routine general medical examination at a health care facility   . Irregular menstrual cycle   . Other screening mammogram   . Routine gynecological examination   . Special screening for malignant neoplasms, colon   . Screening for malignant neoplasm of the rectum   . Gestational diabetes   . Anxiety disorder    Past Surgical History  Procedure Date  . Breast cyst aspiration 9/11 and 2/12    benign  . Lipoma excision     from back  . Cervical cryo 1996  . Basal cell removal     from face   History  Substance Use Topics  . Smoking status: Never  Smoker   . Smokeless tobacco: Not on file  . Alcohol Use: Yes     socially   Family History  Problem Relation Age of Onset  . Diabetes type II Father   . Hypertension Father   . Cancer Father     Bladder  . Hypertension Mother   . Breast cancer Maternal Grandmother     ? female cancer  . Colon polyps Mother    Allergies  Allergen Reactions  . Penicillins     REACTION: rash   No current outpatient prescriptions on file prior to visit.          Review of Systems Review of Systems  Constitutional: Negative for fever, appetite change, fatigue and unexpected weight change.  Eyes: Negative for pain and visual disturbance.  Respiratory: Negative for cough and shortness of breath.   Cardiovascular: Negative for cp or palpitations    Gastrointestinal: Negative for nausea, diarrhea and constipation. no blood in stool , pos for RUQ pain Genitourinary: Negative for urgency and frequency.  Skin: Negative for pallor or rash   Neurological: Negative for weakness, light-headedness, numbness and headaches.  Hematological: Negative for adenopathy. Does not bruise/bleed easily.  Psychiatric/Behavioral: Negative  for dysphoric mood. The patient is not nervous/anxious.          Objective:   Physical Exam  Constitutional: She appears well-developed and well-nourished. No distress.  HENT:  Head: Normocephalic and atraumatic.  Mouth/Throat: Oropharynx is clear and moist.  Eyes: Conjunctivae and EOM are normal. Pupils are equal, round, and reactive to light. No scleral icterus.  Neck: Normal range of motion. Neck supple. No JVD present. Carotid bruit is not present. No thyromegaly present.  Cardiovascular: Normal rate, regular rhythm, normal heart sounds and intact distal pulses.  Exam reveals no gallop.   Pulmonary/Chest: Effort normal and breath sounds normal. No respiratory distress. She has no wheezes.  Abdominal: Soft. Normal aorta and bowel sounds are normal. She exhibits no  shifting dullness, no distension, no pulsatile liver, no abdominal bruit, no ascites and no mass. There is no hepatosplenomegaly. There is tenderness in the right upper quadrant. There is no rebound, no guarding and no CVA tenderness.       Very mild RUQ tenderness without murphy sign  Musculoskeletal: Normal range of motion. She exhibits no edema and no tenderness.  Lymphadenopathy:    She has no cervical adenopathy.  Neurological: No cranial nerve deficit. She exhibits normal muscle tone. Coordination normal.  Skin: Skin is warm and dry. No rash noted. No erythema. No pallor.  Psychiatric: She has a normal mood and affect.          Assessment & Plan:

## 2010-12-08 ENCOUNTER — Telehealth: Payer: Self-pay | Admitting: *Deleted

## 2010-12-08 NOTE — Telephone Encounter (Signed)
I do not have a secure address yet -- that is supposed to be set up later with epic but not at this time So -- please fax or drop it off when she gets a chance--thanks

## 2010-12-08 NOTE — Telephone Encounter (Signed)
Advised pt, she will drop it off tomorrow.

## 2010-12-08 NOTE — Telephone Encounter (Signed)
Pt says you wanted her to get lab results from wellness screen. She has them saved in a pdf file and can email them if you have a secure email address.

## 2010-12-09 ENCOUNTER — Telehealth: Payer: Self-pay | Admitting: *Deleted

## 2010-12-09 DIAGNOSIS — R1011 Right upper quadrant pain: Secondary | ICD-10-CM

## 2010-12-09 NOTE — Telephone Encounter (Signed)
Pt has dropped off lab results for your review.  These are on your shelf.

## 2010-12-11 NOTE — Telephone Encounter (Signed)
My plan was to go ahead and ref her to GI to discuss that  I will do referral

## 2010-12-11 NOTE — Telephone Encounter (Signed)
Advised pt.  She says her symptoms come and go,she still has a nagging discomfort.  She is asking if she should have a test to see if her gallbladder is functioning properly.  She says, actually, her symptoms have been better for the last 2 weeks.

## 2010-12-11 NOTE — Telephone Encounter (Signed)
Please let her know that labs all looked ok -that is re assuring  Please let me know how symptoms are-thanks

## 2010-12-11 NOTE — Telephone Encounter (Signed)
Patient advised as instructed via telephone.  She will wait to hear from [place]. 

## 2010-12-18 ENCOUNTER — Encounter: Payer: Self-pay | Admitting: Internal Medicine

## 2010-12-18 ENCOUNTER — Ambulatory Visit (INDEPENDENT_AMBULATORY_CARE_PROVIDER_SITE_OTHER): Payer: BC Managed Care – PPO | Admitting: Internal Medicine

## 2010-12-18 VITALS — BP 130/82 | HR 78 | Ht 65.0 in | Wt 155.2 lb

## 2010-12-18 DIAGNOSIS — R1011 Right upper quadrant pain: Secondary | ICD-10-CM

## 2010-12-18 NOTE — Patient Instructions (Signed)
You have been scheduled for an Abdominal Ultrasound at [facility] on 12/22/10 at 2:00 pm. Please arrive at 1:45 pm at the Radiology Dept. On the 1st floor. Please have nothing to eat or drink 6 hours prior to the test. We will contact you with results and plans.

## 2010-12-18 NOTE — Progress Notes (Signed)
  Subjective:    Patient ID: Heather Mcdonald, female    DOB: Jul 18, 1955, 55 y.o.   MRN: [id number]  HPI 55 year old white woman with history of a  constant sense of fullness in RUQ. May feel it anytime, does not disturb sleep usually except when she was eating fatty foods on a trip to [place] she awakened with fairly severe pain in the middle of the night. It's the same problem she has had over time, going back to 2009 and an ultrasound then was unrevealing. I had her try probiotics in 2010 but that did not make a difference. She is now noticing some increase in symptoms at times with more severe pain. Disturbing feature is that she really does not go again without this. She did not have it before 2009 or so.  She does not really have any back pain or radicular symptoms though she has had in the past and does not think that she is having a type of pain with this. Movement does not seem to precipitate the pain. Allergies  Allergen Reactions  . Penicillins     REACTION: rash   Outpatient Prescriptions Prior to Visit  Medication Sig Dispense Refill  . Naproxen Sodium (ALEVE) 220 MG CAPS Take 1 capsule by mouth at bedtime as needed.         Past Medical History  Diagnosis Date  . Allergic rhinitis, cause unspecified   . Papanicolaou smear of cervix with atypical squamous cells of undetermined significance (ASC-US)   . Fibrocystic breast disease   . Gestational diabetes   . Anxiety disorder   . History of uterine fibroid    Past Surgical History  Procedure Date  . Breast cyst aspiration 9/11 and 2/12    benign  . Lipoma excision     from back  . Cervical cryo 1996  . Basal cell removal     from face  . Colonoscopy 04/08/2008    normal   History   Social History  . Marital Status: Married    Spouse Name: N/A    Number of Children: 2  . Years of Education: N/A   Occupational History  . Armed forces operational officer    Social History Main Topics  . Smoking status: Never Smoker   .  Smokeless tobacco: Never Used  . Alcohol Use: Yes     socially  . Drug Use: No  .     Other Topics Concern      Social History Narrative   Husband with systemic autoimmune disease, works in [employer] dept\ She is a Futures trader    Family History  Problem Relation Age of Onset  . Diabetes type II Father   . Hypertension Father   . Cancer Father     Bladder  . Hypertension Mother   . Breast cancer Maternal Grandmother     ? female cancer  . Colon polyps Mother          Review of Systems Currently with a dry cough of a few days origin, otherwise no complaints except as above    Objective:   Physical Exam WDWN NAD Soft abdomen, nontender but some rib tenderness and mild CVAT       Assessment & Plan:  Right upper quadrant pain. Etiology unclear. She does relate that her family members have had similar vague problems but did not resolve until cholecystectomy.

## 2010-12-19 NOTE — Assessment & Plan Note (Addendum)
Symptoms are admittedly vague and she understands this. However is persistent. It is mostly chronic in a low intensity. However its bothersome and was not present before 2009. There have been some episodes, most recently when she was in [place] that sound like biliary colic though not many. I think repeating ultrasound is worthwhile, if there were stones I would refer her to a surgeon. If there are not, then we discussed performing a HIDA scan and likely ejection fraction along with that to see if there is some sort of gallbladder dysfunction. Laboratory studies are unrevealing, she does not seem to have musculoskeletal pain either.

## 2010-12-22 ENCOUNTER — Ambulatory Visit (HOSPITAL_COMMUNITY)
Admission: RE | Admit: 2010-12-22 | Discharge: 2010-12-22 | Disposition: A | Discharge status: Home / Self Care | Payer: BC Managed Care – PPO | Source: Ambulatory Visit | Attending: Internal Medicine | Admitting: Internal Medicine

## 2010-12-22 DIAGNOSIS — R1011 Right upper quadrant pain: Secondary | ICD-10-CM | POA: Insufficient documentation

## 2010-12-22 NOTE — Progress Notes (Signed)
Quick Note:  Let her know this is normal (again) Please schedule HIDA with ejection fraction to evaluate RUQ pain - she should be aware this is the next step ______

## 2010-12-23 ENCOUNTER — Other Ambulatory Visit: Payer: Self-pay

## 2010-12-23 DIAGNOSIS — R1011 Right upper quadrant pain: Secondary | ICD-10-CM

## 2011-01-13 ENCOUNTER — Other Ambulatory Visit (HOSPITAL_COMMUNITY): Payer: BC Managed Care – PPO

## 2011-01-20 ENCOUNTER — Telehealth: Payer: Self-pay | Admitting: Internal Medicine

## 2011-01-20 NOTE — Telephone Encounter (Signed)
Reviewed information about HIDA , all questions answered

## 2011-01-21 ENCOUNTER — Encounter (HOSPITAL_COMMUNITY)
Admission: RE | Admit: 2011-01-21 | Discharge: 2011-01-21 | Disposition: A | Discharge status: Home / Self Care | Payer: BC Managed Care – PPO | Source: Ambulatory Visit | Attending: Internal Medicine | Admitting: Internal Medicine

## 2011-01-21 ENCOUNTER — Encounter (HOSPITAL_COMMUNITY): Payer: Self-pay

## 2011-01-21 DIAGNOSIS — R1011 Right upper quadrant pain: Secondary | ICD-10-CM | POA: Insufficient documentation

## 2011-01-21 HISTORY — DX: Right upper quadrant pain: R10.11

## 2011-01-21 MED ORDER — TECHNETIUM TC 99M MEBROFENIN IV KIT
5.5000 | PACK | Freq: Once | INTRAVENOUS | Status: AC | PRN
  Administered 2011-01-21: 5.5 via INTRAVENOUS

## 2011-01-21 MED ORDER — SINCALIDE 5 MCG IJ SOLR: 0.0200 ug/kg | Freq: Once | INTRAMUSCULAR | Status: DC

## 2011-01-22 ENCOUNTER — Encounter: Payer: Self-pay | Admitting: Internal Medicine

## 2011-01-22 NOTE — Progress Notes (Signed)
Quick Note:  Please let her know that the gallbladder did not empty properly. As discussed (I think) this could explain some or all of her symptoms. Removing the gallbladder may be the only way to tell but cannot guarantee that it will be 100% successful.  If she is willing I will refer her to a surgeon - let me know if she has a request or if she wants me to call her.  ______

## 2011-01-27 ENCOUNTER — Telehealth: Payer: Self-pay | Admitting: Internal Medicine

## 2011-01-27 NOTE — Telephone Encounter (Signed)
Dr Leone Payor can you please call her?  See my note on HIDA from 12/23/11

## 2011-01-28 ENCOUNTER — Encounter: Payer: Self-pay | Admitting: Internal Medicine

## 2011-01-28 ENCOUNTER — Other Ambulatory Visit: Payer: Self-pay

## 2011-01-28 DIAGNOSIS — K829 Disease of gallbladder, unspecified: Secondary | ICD-10-CM

## 2011-01-28 DIAGNOSIS — K828 Other specified diseases of gallbladder: Secondary | ICD-10-CM

## 2011-01-28 HISTORY — DX: Disease of gallbladder, unspecified: K82.9

## 2011-01-28 NOTE — Telephone Encounter (Signed)
I called her and explained concept of gallbladder dyskinesia and that we usually think about 30% chance of cholecystectomy solving all the pain her. Reviewed pros/cons of surgery and some of the risks.  I have recommended and she agrees with surgical consult to learn more about this problem and possible cholecystectomy. Her husband had his removed by Dr. Jamey Ripa and she requests to see him.

## 2011-01-28 NOTE — Telephone Encounter (Signed)
I have sent the referral to [facility], I have left a message for [facility] the referral coordinator.  Will await a call back

## 2011-01-29 NOTE — Telephone Encounter (Signed)
Patient advised of the appt Dr Dr Jamey Ripa 02/09/11 9:45

## 2011-02-09 ENCOUNTER — Ambulatory Visit (INDEPENDENT_AMBULATORY_CARE_PROVIDER_SITE_OTHER): Payer: BC Managed Care – PPO | Admitting: Surgery

## 2011-02-09 ENCOUNTER — Other Ambulatory Visit (INDEPENDENT_AMBULATORY_CARE_PROVIDER_SITE_OTHER): Payer: Self-pay | Admitting: Surgery

## 2011-02-09 ENCOUNTER — Encounter (INDEPENDENT_AMBULATORY_CARE_PROVIDER_SITE_OTHER): Payer: Self-pay | Admitting: Surgery

## 2011-02-09 VITALS — BP 140/88 | HR 72 | Temp 99.2°F | Resp 12 | Ht 65.0 in | Wt 153.2 lb

## 2011-02-09 DIAGNOSIS — K829 Disease of gallbladder, unspecified: Secondary | ICD-10-CM

## 2011-02-09 NOTE — Patient Instructions (Signed)
We will schedule surgery to remove your gallbladder 

## 2011-02-09 NOTE — Progress Notes (Signed)
Patient ID: Heather Mcdonald, female   DOB: 11/04/1955, 55 y.o.   MRN: [id number]  Chief Complaint  Patient presents with  . Other    new pt- eval GB   Referred by: Dr Tower  HPI Heather Mcdonald is a 55 y.o. female.  She has had about two years of abdominal discomfort primarily right upper quadrant and extending somewhat bandlike area in the epigastrium around the right side and to the back a little bit. She sometimes has nausea. He had a very bad exacerbation while on vacation in [place] last summer. She continues to have intermittent discomfort but can't specifically related to foods. She had an ultrasound two years ago and another one more recently both of which showed no evidence of stones. She then had a hepatobiliary scan which shows a markedly reduced ejection fraction. She comes to see me to discuss potential cholecystectomy. She's not had any symptoms today. HPI  Past Medical History  Diagnosis Date  . Allergic rhinitis, cause unspecified   . Papanicolaou smear of cervix with atypical squamous cells of undetermined significance (ASC-US)   . Fibrocystic breast disease   . Gestational diabetes   . Anxiety disorder   . History of uterine fibroid   . RUQ pain     Past Surgical History  Procedure Date  . Breast cyst aspiration 9/11 and 2/12    benign  . Lipoma excision     from back  . Cervical cryo 1996  . Basal cell removal     from face  . Colonoscopy 04/08/2008    normal    Family History  Problem Relation Age of Onset  . Diabetes type II Father   . Hypertension Father   . Cancer Father     Bladder  . Hypertension Mother   . Colon polyps Mother   . Breast cancer Maternal Grandmother     ? female cancer  . Cancer Maternal Grandmother     breast    Social History History  Substance Use Topics  . Smoking status: Never Smoker   . Smokeless tobacco: Never Used  . Alcohol Use: Yes     socially    Allergies  Allergen Reactions  . Penicillins    REACTION: rash    No current outpatient prescriptions on file.    Review of Systems Review of Systems  Constitutional: Negative for fever, chills and unexpected weight change.  HENT: Negative for hearing loss, congestion, sore throat, trouble swallowing and voice change.   Eyes: Negative for visual disturbance.  Respiratory: Negative for cough and wheezing.   Cardiovascular: Negative for chest pain, palpitations and leg swelling.  Gastrointestinal: Positive for abdominal pain. Negative for nausea, vomiting, diarrhea, constipation, blood in stool, abdominal distention and anal bleeding.  Genitourinary: Negative for hematuria, vaginal bleeding and difficulty urinating.  Musculoskeletal: Negative for arthralgias.  Skin: Negative for rash and wound.  Neurological: Negative for seizures, syncope and headaches.  Hematological: Negative for adenopathy. Does not bruise/bleed easily.  Psychiatric/Behavioral: Negative for confusion.    Blood pressure 140/88, pulse 72, temperature 99.2 F (37.3 C), temperature source Temporal, resp. rate 12, height 5' 5" (1.651 m), weight 153 lb 3.2 oz (69.491 kg).  Physical Exam Physical Exam  Vitals reviewed. Constitutional: She is oriented to person, place, and time. She appears well-developed and well-nourished. No distress.  HENT:  Head: Normocephalic and atraumatic.  Mouth/Throat: Oropharynx is clear and moist.  Eyes: Conjunctivae and EOM are normal. Pupils are equal, round, and reactive   to light. No scleral icterus.  Neck: Normal range of motion. Neck supple. No tracheal deviation present. No thyromegaly present.  Cardiovascular: Normal rate, regular rhythm, normal heart sounds and intact distal pulses.  Exam reveals no gallop and no friction rub.   No murmur heard. Pulmonary/Chest: Effort normal and breath sounds normal. No respiratory distress. She has no wheezes. She has no rales.  Abdominal: Soft. Bowel sounds are normal. She exhibits no  distension and no mass. There is no tenderness. There is no rebound and no guarding.  Musculoskeletal: Normal range of motion. She exhibits no edema and no tenderness.  Neurological: She is alert and oriented to person, place, and time.  Skin: Skin is warm and dry. No rash noted. She is not diaphoretic. No erythema.  Psychiatric: She has a normal mood and affect. Her behavior is normal. Judgment and thought content normal.    Data Reviewed I have reviewed labs, xrays, and office notes in Epic  Assessment    Biliary dyskinesia    Plan    I have recommended laparoscopic cholecystectomy for her diagnosis of biliary dyskinesia. I think that she has a significant improvement. However I told her I could not guarantee that she would not continue to have symptoms.I have discussed the indications for laparoscopic cholecystectomy with her and provided educational material. We have discussed the risks of surgery, including general risks such as bleeding, infection, lung and heart issues etc. We have also discussed the potential for injuries to other organs, bile duct leaks, and other unexpected events. We have also talked about the fact that this may need to be converted to open under certain circumstances. We discussed the typical post op recovery and the fact that there is a good likelihood of improvement in symptoms and return to normal activity.  She understands this and wishes to proceed to schedule surgery. I believe all of .his questions have been answered.          Kirk Basquez J 02/09/2011, 10:09 AM    

## 2011-02-17 ENCOUNTER — Encounter (HOSPITAL_COMMUNITY): Payer: Self-pay | Admitting: Pharmacy Technician

## 2011-02-23 ENCOUNTER — Encounter (HOSPITAL_COMMUNITY): Payer: Self-pay

## 2011-02-23 ENCOUNTER — Encounter (HOSPITAL_COMMUNITY)
Admission: RE | Admit: 2011-02-23 | Discharge: 2011-02-23 | Disposition: A | Discharge status: Home / Self Care | Payer: BC Managed Care – PPO | Source: Ambulatory Visit | Attending: Surgery | Admitting: Surgery

## 2011-02-23 HISTORY — DX: Pain in unspecified joint: M25.50

## 2011-02-23 HISTORY — DX: Bronchitis, not specified as acute or chronic: J40

## 2011-02-23 LAB — DIFFERENTIAL
Basophils Relative: 0 % (ref 0–1)
Eosinophils Absolute: 0.1 10*3/uL (ref 0.0–0.7)
Lymphs Abs: 1.6 10*3/uL (ref 0.7–4.0)
Monocytes Absolute: 0.4 10*3/uL (ref 0.1–1.0)
Monocytes Relative: 5 % (ref 3–12)

## 2011-02-23 LAB — CBC
Platelets: 230 10*3/uL (ref 150–400)
RDW: 12.5 % (ref 11.5–15.5)
WBC: 7.1 10*3/uL (ref 4.0–10.5)

## 2011-02-23 LAB — URINALYSIS, ROUTINE W REFLEX MICROSCOPIC
Glucose, UA: NEGATIVE mg/dL
Hgb urine dipstick: NEGATIVE
Protein, ur: NEGATIVE mg/dL
pH: 7 (ref 5.0–8.0)

## 2011-02-23 LAB — COMPREHENSIVE METABOLIC PANEL
AST: 11 U/L (ref 0–37)
Albumin: 3.8 g/dL (ref 3.5–5.2)
Alkaline Phosphatase: 66 U/L (ref 39–117)
Chloride: 101 mEq/L (ref 96–112)
Potassium: 3.8 mEq/L (ref 3.5–5.1)
Sodium: 139 mEq/L (ref 135–145)
Total Bilirubin: 0.4 mg/dL (ref 0.3–1.2)

## 2011-02-23 LAB — URINE MICROSCOPIC-ADD ON

## 2011-02-23 LAB — SURGICAL PCR SCREEN: MRSA, PCR: NEGATIVE

## 2011-02-23 MED ORDER — CHLORHEXIDINE GLUCONATE 4 % EX LIQD: 1.0000 "application " | Freq: Once | CUTANEOUS | Status: DC

## 2011-02-23 NOTE — Progress Notes (Signed)
Reactive hypoglycemia 3yrs ago;has not happened since

## 2011-02-23 NOTE — Pre-Procedure Instructions (Signed)
20 [name]  02/23/2011   Your procedure is scheduled on:  Tues, Feb 12 @ 1000  Report to [facility] at 0800 AM.  Call this number if you have problems the morning of surgery: [phone]   Remember:   Do not eat food:After Midnight.  May have clear liquids: up to 4 Hours before arrival.  Clear liquids include soda, tea, black coffee, apple or grape juice, broth.  Take these medicines the morning of surgery with A SIP OF WATER:    Do not wear jewelry, make-up or nail polish.  Do not wear lotions, powders, or perfumes. You may wear deodorant.  Do not shave 48 hours prior to surgery.  Do not bring valuables to the hospital.  Contacts, dentures or bridgework may not be worn into surgery.  Leave suitcase in the car. After surgery it may be brought to your room.  For patients admitted to the hospital, checkout time is 11:00 AM the day of discharge.   Patients discharged the day of surgery will not be allowed to drive home.  Name and phone number of your driver:   Special Instructions: CHG Shower Use Special Wash: 1/2 bottle night before surgery and 1/2 bottle morning of surgery.   Please read over the following fact sheets that you were given: Pain Booklet, Coughing and Deep Breathing, MRSA Information and Surgical Site Infection Prevention

## 2011-02-23 NOTE — Progress Notes (Signed)
Pt has never had a heart cath/echo/stress test  Pt doesn't have a cardiologist

## 2011-03-02 ENCOUNTER — Encounter (HOSPITAL_COMMUNITY)
Admission: RE | Disposition: A | Discharge status: Home / Self Care | Payer: Self-pay | Source: Ambulatory Visit | Attending: Surgery

## 2011-03-02 ENCOUNTER — Encounter (HOSPITAL_COMMUNITY): Payer: Self-pay | Admitting: Anesthesiology

## 2011-03-02 ENCOUNTER — Ambulatory Visit (HOSPITAL_COMMUNITY)
Admission: RE | Admit: 2011-03-02 | Discharge: 2011-03-02 | Disposition: A | Discharge status: Home / Self Care | Payer: BC Managed Care – PPO | Source: Ambulatory Visit | Attending: Surgery | Admitting: Surgery

## 2011-03-02 ENCOUNTER — Ambulatory Visit (HOSPITAL_COMMUNITY): Payer: BC Managed Care – PPO | Admitting: Anesthesiology

## 2011-03-02 ENCOUNTER — Encounter (HOSPITAL_COMMUNITY): Payer: Self-pay | Admitting: *Deleted

## 2011-03-02 ENCOUNTER — Other Ambulatory Visit (INDEPENDENT_AMBULATORY_CARE_PROVIDER_SITE_OTHER): Payer: Self-pay | Admitting: Surgery

## 2011-03-02 DIAGNOSIS — K829 Disease of gallbladder, unspecified: Secondary | ICD-10-CM

## 2011-03-02 DIAGNOSIS — K811 Chronic cholecystitis: Secondary | ICD-10-CM

## 2011-03-02 DIAGNOSIS — K824 Cholesterolosis of gallbladder: Secondary | ICD-10-CM

## 2011-03-02 DIAGNOSIS — Z01812 Encounter for preprocedural laboratory examination: Secondary | ICD-10-CM | POA: Insufficient documentation

## 2011-03-02 HISTORY — PX: CHOLECYSTECTOMY: SHX55

## 2011-03-02 SURGERY — LAPAROSCOPIC CHOLECYSTECTOMY
Anesthesia: General | Site: Abdomen | Wound class: Clean Contaminated

## 2011-03-02 MED ORDER — LIDOCAINE HCL (CARDIAC) 20 MG/ML IV SOLN
INTRAVENOUS | Status: DC | PRN
  Administered 2011-03-02: 80 mg via INTRAVENOUS

## 2011-03-02 MED ORDER — ACETAMINOPHEN 650 MG RE SUPP: 650.0000 mg | RECTAL | Status: DC | PRN

## 2011-03-02 MED ORDER — GLYCOPYRROLATE 0.2 MG/ML IJ SOLN
INTRAMUSCULAR | Status: DC | PRN
  Administered 2011-03-02: .8 mg via INTRAVENOUS

## 2011-03-02 MED ORDER — ACETAMINOPHEN 325 MG PO TABS: 650.0000 mg | ORAL_TABLET | ORAL | Status: DC | PRN

## 2011-03-02 MED ORDER — HYDROMORPHONE HCL PF 1 MG/ML IJ SOLN
0.2500 mg | INTRAMUSCULAR | Status: DC | PRN
  Administered 2011-03-02: 0.5 mg via INTRAVENOUS

## 2011-03-02 MED ORDER — ONDANSETRON HCL 4 MG/2ML IJ SOLN
INTRAMUSCULAR | Status: DC | PRN
  Administered 2011-03-02: 4 mg via INTRAVENOUS

## 2011-03-02 MED ORDER — PROMETHAZINE HCL 25 MG/ML IJ SOLN: 12.5000 mg | Freq: Four times a day (QID) | INTRAMUSCULAR | Status: DC | PRN

## 2011-03-02 MED ORDER — MORPHINE SULFATE 2 MG/ML IJ SOLN: 2.0000 mg | INTRAMUSCULAR | Status: DC | PRN

## 2011-03-02 MED ORDER — IOHEXOL 300 MG/ML  SOLN
INTRAMUSCULAR | Status: DC | PRN
  Administered 2011-03-02: 50 mL via INTRAVENOUS

## 2011-03-02 MED ORDER — OXYCODONE HCL 5 MG PO TABS: 5.0000 mg | ORAL_TABLET | ORAL | Status: DC | PRN

## 2011-03-02 MED ORDER — ONDANSETRON HCL 4 MG/2ML IJ SOLN: 4.0000 mg | Freq: Four times a day (QID) | INTRAMUSCULAR | Status: DC | PRN

## 2011-03-02 MED ORDER — EPHEDRINE SULFATE 50 MG/ML IJ SOLN
INTRAMUSCULAR | Status: DC | PRN
  Administered 2011-03-02 (×2): 5 mg via INTRAVENOUS
  Administered 2011-03-02: 10 mg via INTRAVENOUS

## 2011-03-02 MED ORDER — BUPIVACAINE HCL (PF) 0.25 % IJ SOLN
INTRAMUSCULAR | Status: DC | PRN
  Administered 2011-03-02: 20 mL

## 2011-03-02 MED ORDER — 0.9 % SODIUM CHLORIDE (POUR BTL) OPTIME
TOPICAL | Status: DC | PRN
  Administered 2011-03-02: 1000 mL

## 2011-03-02 MED ORDER — SODIUM CHLORIDE 0.9 % IJ SOLN: 3.0000 mL | Freq: Two times a day (BID) | INTRAMUSCULAR | Status: DC

## 2011-03-02 MED ORDER — FENTANYL CITRATE 0.05 MG/ML IJ SOLN
INTRAMUSCULAR | Status: DC | PRN
  Administered 2011-03-02: 100 ug via INTRAVENOUS
  Administered 2011-03-02: 50 ug via INTRAVENOUS

## 2011-03-02 MED ORDER — MIDAZOLAM HCL 5 MG/5ML IJ SOLN
INTRAMUSCULAR | Status: DC | PRN
  Administered 2011-03-02: 2 mg via INTRAVENOUS

## 2011-03-02 MED ORDER — ONDANSETRON HCL 4 MG/2ML IJ SOLN
4.0000 mg | Freq: Four times a day (QID) | INTRAMUSCULAR | Status: DC | PRN
Start: 2011-03-02 — End: 2011-03-02

## 2011-03-02 MED ORDER — PROPOFOL 10 MG/ML IV EMUL
INTRAVENOUS | Status: DC | PRN
  Administered 2011-03-02: 10 mg via INTRAVENOUS

## 2011-03-02 MED ORDER — SODIUM CHLORIDE 0.9 % IV SOLN
250.0000 mL | INTRAVENOUS | Status: DC | PRN
Start: 2011-03-02 — End: 2011-03-02

## 2011-03-02 MED ORDER — LACTATED RINGERS IV SOLN
INTRAVENOUS | Status: DC | PRN
  Administered 2011-03-02 (×2): via INTRAVENOUS

## 2011-03-02 MED ORDER — NEOSTIGMINE METHYLSULFATE 1 MG/ML IJ SOLN
INTRAMUSCULAR | Status: DC | PRN
  Administered 2011-03-02: 5 mg via INTRAVENOUS

## 2011-03-02 MED ORDER — SODIUM CHLORIDE 0.9 % IR SOLN
Status: DC | PRN
  Administered 2011-03-02: 1000 mL

## 2011-03-02 MED ORDER — OXYCODONE-ACETAMINOPHEN 5-325 MG PO TABS: 1.0000 | ORAL_TABLET | ORAL | Status: DC | PRN

## 2011-03-02 MED ORDER — ROCURONIUM BROMIDE 100 MG/10ML IV SOLN
INTRAVENOUS | Status: DC | PRN
  Administered 2011-03-02: 50 mg via INTRAVENOUS

## 2011-03-02 MED ORDER — SODIUM CHLORIDE 0.9 % IJ SOLN: 3.0000 mL | INTRAMUSCULAR | Status: DC | PRN

## 2011-03-02 MED ORDER — OXYCODONE-ACETAMINOPHEN 5-325 MG PO TABS: 1.0000 | ORAL_TABLET | ORAL | Status: AC | PRN

## 2011-03-02 SURGICAL SUPPLY — 54 items
ADH SKN CLS APL DERMABOND .7 (GAUZE/BANDAGES/DRESSINGS)
ADH SKN CLS LQ APL DERMABOND (GAUZE/BANDAGES/DRESSINGS) ×2
APPLIER CLIP ROT 10 11.4 M/L (STAPLE) ×3
APR CLP MED LRG 11.4X10 (STAPLE) ×2
BAG SPEC RTRVL LRG 6X4 10 (ENDOMECHANICALS) ×2
BLADE SURG ROTATE 9660 (MISCELLANEOUS) IMPLANT
CANISTER SUCTION 2500CC (MISCELLANEOUS) ×3 IMPLANT
CHLORAPREP W/TINT 26ML (MISCELLANEOUS) ×3 IMPLANT
CLIP APPLIE ROT 10 11.4 M/L (STAPLE) ×2 IMPLANT
CLOTH BEACON ORANGE TIMEOUT ST (SAFETY) ×3 IMPLANT
COVER MAYO STAND STRL (DRAPES) ×2 IMPLANT
COVER SURGICAL LIGHT HANDLE (MISCELLANEOUS) ×3 IMPLANT
DECANTER SPIKE VIAL GLASS SM (MISCELLANEOUS) ×1 IMPLANT
DERMABOND ADHESIVE PROPEN (GAUZE/BANDAGES/DRESSINGS) ×1
DERMABOND ADVANCED (GAUZE/BANDAGES/DRESSINGS)
DERMABOND ADVANCED .7 DNX12 (GAUZE/BANDAGES/DRESSINGS) ×1 IMPLANT
DERMABOND ADVANCED .7 DNX6 (GAUZE/BANDAGES/DRESSINGS) ×1 IMPLANT
DRAPE C-ARM 42X72 X-RAY (DRAPES) ×2 IMPLANT
DRAPE UTILITY 15X26 W/TAPE STR (DRAPE) ×6 IMPLANT
ELECT REM PT RETURN 9FT ADLT (ELECTROSURGICAL) ×3
ELECTRODE REM PT RTRN 9FT ADLT (ELECTROSURGICAL) ×2 IMPLANT
FILTER SMOKE EVAC LAPAROSHD (FILTER) IMPLANT
GLOVE BIO SURGEON STRL SZ8 (GLOVE) ×2 IMPLANT
GLOVE BIOGEL PI IND STRL 7.0 (GLOVE) ×4 IMPLANT
GLOVE BIOGEL PI IND STRL 7.5 (GLOVE) ×2 IMPLANT
GLOVE BIOGEL PI IND STRL 8 (GLOVE) ×1 IMPLANT
GLOVE BIOGEL PI INDICATOR 7.0 (GLOVE) ×4
GLOVE BIOGEL PI INDICATOR 7.5 (GLOVE) ×2
GLOVE BIOGEL PI INDICATOR 8 (GLOVE) ×1
GLOVE EUDERMIC 7 POWDERFREE (GLOVE) ×3 IMPLANT
GLOVE SS BIOGEL STRL SZ 6.5 (GLOVE) ×1 IMPLANT
GLOVE SUPERSENSE BIOGEL SZ 6.5 (GLOVE) ×1
GLOVE SURG SS PI 6.5 STRL IVOR (GLOVE) ×4 IMPLANT
GLOVE SURG SS PI 7.0 STRL IVOR (GLOVE) ×4 IMPLANT
GOWN PREVENTION PLUS XLARGE (GOWN DISPOSABLE) ×3 IMPLANT
GOWN STRL NON-REIN LRG LVL3 (GOWN DISPOSABLE) ×14 IMPLANT
KIT BASIN OR (CUSTOM PROCEDURE TRAY) ×3 IMPLANT
KIT ROOM TURNOVER OR (KITS) ×3 IMPLANT
NS IRRIG 1000ML POUR BTL (IV SOLUTION) ×3 IMPLANT
PAD ARMBOARD 7.5X6 YLW CONV (MISCELLANEOUS) ×6 IMPLANT
POUCH SPECIMEN RETRIEVAL 10MM (ENDOMECHANICALS) ×3 IMPLANT
SCISSORS LAP 5X35 DISP (ENDOMECHANICALS) ×1 IMPLANT
SET CHOLANGIOGRAPH 5 50 .035 (SET/KITS/TRAYS/PACK) ×2 IMPLANT
SET IRRIG TUBING LAPAROSCOPIC (IRRIGATION / IRRIGATOR) ×3 IMPLANT
SLEEVE Z-THREAD 5X100MM (TROCAR) ×3 IMPLANT
SPECIMEN JAR SMALL (MISCELLANEOUS) ×3 IMPLANT
SUT MNCRL AB 4-0 PS2 18 (SUTURE) ×3 IMPLANT
TOWEL OR 17X24 6PK STRL BLUE (TOWEL DISPOSABLE) ×3 IMPLANT
TOWEL OR 17X26 10 PK STRL BLUE (TOWEL DISPOSABLE) ×3 IMPLANT
TRAY LAPAROSCOPIC (CUSTOM PROCEDURE TRAY) ×3 IMPLANT
TROCAR XCEL BLUNT TIP 100MML (ENDOMECHANICALS) ×3 IMPLANT
TROCAR Z-THREAD FIOS 11X100 BL (TROCAR) ×3 IMPLANT
TROCAR Z-THREAD FIOS 5X100MM (TROCAR) ×5 IMPLANT
WATER STERILE IRR 1000ML POUR (IV SOLUTION) IMPLANT

## 2011-03-02 NOTE — Anesthesia Procedure Notes (Addendum)
Procedure Name: Intubation Date/Time: 03/02/2011 11:52 AM Performed by: Fuller Canada Pre-anesthesia Checklist: Patient identified, Timeout performed, Emergency Drugs available, Suction available and Patient being monitored Patient Re-evaluated:Patient Re-evaluated prior to inductionOxygen Delivery Method: Circle System Utilized Preoxygenation: Pre-oxygenation with 100% oxygen Intubation Type: IV induction Ventilation: Mask ventilation without difficulty and Oral airway inserted - appropriate to patient size Laryngoscope Size: Hyacinth Meeker and 2 Alene Mires, [place]) Grade View: Grade I Tube type: Oral Tube size: 7.5 mm Number of attempts: 1 Airway Equipment and Method: stylet Placement Confirmation: ETT inserted through vocal cords under direct vision,  breath sounds checked- equal and bilateral,  positive ETCO2 and CO2 detector Secured at: 21 cm Tube secured with: Tape Dental Injury: Teeth and Oropharynx as per pre-operative assessment

## 2011-03-02 NOTE — Interval H&P Note (Signed)
History and Physical Interval Note:  03/02/2011 9:56 AM  Heather Mcdonald  has presented today for surgery, with the diagnosis of biliary dyskinesia   The various methods of treatment have been discussed with the patient and family. After consideration of risks, benefits and other options for treatment, the patient has consented to  Procedure(s) (LRB): LAPAROSCOPIC CHOLECYSTECTOMY WITH INTRAOPERATIVE CHOLANGIOGRAM (N/A) as a surgical intervention .  The patients' history has been reviewed, patient examined, no change in status, stable for surgery.  I have reviewed the patients' chart and labs.  Questions were answered to the patient's satisfaction.     Charnele Semple J

## 2011-03-02 NOTE — Discharge Instructions (Signed)
[facility] ______[facility] °LAPAROSCOPIC SURGERY: POST OP INSTRUCTIONS °Always review your discharge instruction sheet given to you by the facility where your surgery was performed. °IF YOU HAVE DISABILITY OR FAMILY LEAVE FORMS, YOU MUST BRING THEM TO THE OFFICE FOR PROCESSING.   °DO NOT GIVE THEM TO YOUR DOCTOR. ° °1. A prescription for pain medication may be given to you upon discharge.  Take your pain medication as prescribed, if needed.  If narcotic pain medicine is not needed, then you may take acetaminophen (Tylenol) or ibuprofen (Advil) as needed. °2. Take your usually prescribed medications unless otherwise directed. °3. If you need a refill on your pain medication, please contact your pharmacy.  They will contact our office to request authorization. Prescriptions will not be filled after 5pm or on week-ends. °4. You should follow a light diet the first few days after arrival home, such as soup and crackers, etc.  Be sure to include lots of fluids daily. °5. Most patients will experience some swelling and bruising in the area of the incisions.  Ice packs will help.  Swelling and bruising can take several days to resolve.  °6. It is common to experience some constipation if taking pain medication after surgery.  Increasing fluid intake and taking a stool softener (such as Colace) will usually help or prevent this problem from occurring.  A mild laxative (Milk of Magnesia or Miralax) should be taken according to package instructions if there are no bowel movements after 48 hours. °7. Unless discharge instructions indicate otherwise, you may remove your bandages 24-48 hours after surgery, and you may shower at that time.  You may have steri-strips (small skin tapes) in place directly over the incision.  These strips should be left on the skin for 7-10 days.  If your surgeon used skin glue on the incision, you may shower in 24 hours.  The glue will flake off over the next 2-3 weeks.  Any sutures or  staples will be removed at the office during your follow-up visit. °8. ACTIVITIES:  You may resume regular (light) daily activities beginning the next day--such as daily self-care, walking, climbing stairs--gradually increasing activities as tolerated.  You may have sexual intercourse when it is comfortable.  Refrain from any heavy lifting or straining until approved by your doctor. °a. You may drive when you are no longer taking prescription pain medication, you can comfortably wear a seatbelt, and you can safely maneuver your car and apply brakes. °b. RETURN TO WORK:  __________________________________________________________ °9. You should see your doctor in the office for a follow-up appointment approximately 2-3 weeks after your surgery.  Make sure that you call for this appointment within a day or two after you arrive home to insure a convenient appointment time. °10. OTHER INSTRUCTIONS: __________________________________________________________________________________________________________________________ __________________________________________________________________________________________________________________________ °WHEN TO CALL YOUR DOCTOR: °1. Fever over 101.0 °2. Inability to urinate °3. Continued bleeding from incision. °4. Increased pain, redness, or drainage from the incision. °5. Increasing abdominal pain ° °The clinic staff is available to answer your questions during regular business hours.  Please don’t hesitate to call and ask to speak to one of the nurses for clinical concerns.  If you have a medical emergency, go to the nearest emergency room or call 911.  A surgeon from [facility] is always on call at the hospital. °[address], [address], [place], [place]  [address] ? [address] [address], , [place]   [address] °[phone] ? [phone] ? FAX [phone] °Web site:   [email or web address] °

## 2011-03-02 NOTE — Anesthesia Postprocedure Evaluation (Signed)
  Anesthesia Post-op Note  Patient: Heather Mcdonald  Procedure(s) Performed: Procedure(s) (LRB): LAPAROSCOPIC CHOLECYSTECTOMY WITH INTRAOPERATIVE CHOLANGIOGRAM (N/A)  Patient Location: PACU  Anesthesia Type: General  Level of Consciousness: awake, alert  and oriented  Airway and Oxygen Therapy: Patient Spontanous Breathing  Post-op Pain: mild  Post-op Assessment: Post-op Vital signs reviewed, Patient's Cardiovascular Status Stable, Respiratory Function Stable, Patent Airway and No signs of Nausea or vomiting  Post-op Vital Signs: Reviewed and stable  Complications: No apparent anesthesia complications

## 2011-03-02 NOTE — H&P (View-Only) (Signed)
Patient ID: Heather Mcdonald, female   DOB: 07-03-1955, 56 y.o.   MRN: [id number]  Chief Complaint  Patient presents with  . Other    new pt- eval GB   Referred by: Dr Milinda Antis  HPI Heather Mcdonald is a 56 y.o. female.  She has had about two years of abdominal discomfort primarily right upper quadrant and extending somewhat bandlike area in the epigastrium around the right side and to the back a little bit. She sometimes has nausea. He had a very bad exacerbation while on vacation in [place] last summer. She continues to have intermittent discomfort but can't specifically related to foods. She had an ultrasound two years ago and another one more recently both of which showed no evidence of stones. She then had a hepatobiliary scan which shows a markedly reduced ejection fraction. She comes to see me to discuss potential cholecystectomy. She's not had any symptoms today. HPI  Past Medical History  Diagnosis Date  . Allergic rhinitis, cause unspecified   . Papanicolaou smear of cervix with atypical squamous cells of undetermined significance (ASC-US)   . Fibrocystic breast disease   . Gestational diabetes   . Anxiety disorder   . History of uterine fibroid   . RUQ pain     Past Surgical History  Procedure Date  . Breast cyst aspiration 9/11 and 2/12    benign  . Lipoma excision     from back  . Cervical cryo 1996  . Basal cell removal     from face  . Colonoscopy 04/08/2008    normal    Family History  Problem Relation Age of Onset  . Diabetes type II Father   . Hypertension Father   . Cancer Father     Bladder  . Hypertension Mother   . Colon polyps Mother   . Breast cancer Maternal Grandmother     ? female cancer  . Cancer Maternal Grandmother     breast    Social History History  Substance Use Topics  . Smoking status: Never Smoker   . Smokeless tobacco: Never Used  . Alcohol Use: Yes     socially    Allergies  Allergen Reactions  . Penicillins    REACTION: rash    No current outpatient prescriptions on file.    Review of Systems Review of Systems  Constitutional: Negative for fever, chills and unexpected weight change.  HENT: Negative for hearing loss, congestion, sore throat, trouble swallowing and voice change.   Eyes: Negative for visual disturbance.  Respiratory: Negative for cough and wheezing.   Cardiovascular: Negative for chest pain, palpitations and leg swelling.  Gastrointestinal: Positive for abdominal pain. Negative for nausea, vomiting, diarrhea, constipation, blood in stool, abdominal distention and anal bleeding.  Genitourinary: Negative for hematuria, vaginal bleeding and difficulty urinating.  Musculoskeletal: Negative for arthralgias.  Skin: Negative for rash and wound.  Neurological: Negative for seizures, syncope and headaches.  Hematological: Negative for adenopathy. Does not bruise/bleed easily.  Psychiatric/Behavioral: Negative for confusion.    Blood pressure 140/88, pulse 72, temperature 99.2 F (37.3 C), temperature source Temporal, resp. rate 12, height 5\' 5"  (1.651 m), weight 153 lb 3.2 oz (69.491 kg).  Physical Exam Physical Exam  Vitals reviewed. Constitutional: She is oriented to person, place, and time. She appears well-developed and well-nourished. No distress.  HENT:  Head: Normocephalic and atraumatic.  Mouth/Throat: Oropharynx is clear and moist.  Eyes: Conjunctivae and EOM are normal. Pupils are equal, round, and reactive  to light. No scleral icterus.  Neck: Normal range of motion. Neck supple. No tracheal deviation present. No thyromegaly present.  Cardiovascular: Normal rate, regular rhythm, normal heart sounds and intact distal pulses.  Exam reveals no gallop and no friction rub.   No murmur heard. Pulmonary/Chest: Effort normal and breath sounds normal. No respiratory distress. She has no wheezes. She has no rales.  Abdominal: Soft. Bowel sounds are normal. She exhibits no  distension and no mass. There is no tenderness. There is no rebound and no guarding.  Musculoskeletal: Normal range of motion. She exhibits no edema and no tenderness.  Neurological: She is alert and oriented to person, place, and time.  Skin: Skin is warm and dry. No rash noted. She is not diaphoretic. No erythema.  Psychiatric: She has a normal mood and affect. Her behavior is normal. Judgment and thought content normal.    Data Reviewed I have reviewed labs, xrays, and office notes in Epic  Assessment    Biliary dyskinesia    Plan    I have recommended laparoscopic cholecystectomy for her diagnosis of biliary dyskinesia. I think that she has a significant improvement. However I told her I could not guarantee that she would not continue to have symptoms.I have discussed the indications for laparoscopic cholecystectomy with her and provided educational material. We have discussed the risks of surgery, including general risks such as bleeding, infection, lung and heart issues etc. We have also discussed the potential for injuries to other organs, bile duct leaks, and other unexpected events. We have also talked about the fact that this may need to be converted to open under certain circumstances. We discussed the typical post op recovery and the fact that there is a good likelihood of improvement in symptoms and return to normal activity.  She understands this and wishes to proceed to schedule surgery. I believe all of .his questions have been answered.          Kodi Guerrera J 02/09/2011, 10:09 AM

## 2011-03-02 NOTE — Op Note (Signed)
Heather Mcdonald [birth year] [id number] 02/09/2011  Preoperative diagnosis: Biliary dyskinesia  Postoperative diagnosis: Same  Procedure:Laparoscopic cholecystectomy  Surgeon: Currie Paris, MD, FACS  Assistant surgeon: Barnetta Chapel, PA   Anesthesia:General  Clinical History and Indications: This patient has known gallstones and comes in today for cholecystectomy.  Description of procedure: The patient was seen in the preoperative area. I reviewed the plans for the procedure with her as well as the risks and complications. She had no further questions.  The patient was taken to the operating room. After satisfactory general endotracheal anesthesia had been obtained the abdomen was prepped and draped. A time out was done.  0.25% plain Marcaine was used at all incisions. I made an umbilical incision, identified the fascia and opened that, and entered the peritoneal cavity under direct vision. A 0 Vicryl pursestring suture was placed and the Hasson cannula was introduced under direct vision and secured with the pursestring. The abdomen was inflated to 15 cm.  The camera was placed and there were no gross abnormalities. The patient was then placed in reverse Trendelenburg and tilted to the left. A 10/11 trocar was placed in the epigastrium and two 5 mm trochars placed laterally all under direct vision.  The gallbladder looked normal. There were no adhesions.The peritoneum was opened on either side of cystic duct identified a long segment of cystic duct and cystic artery. I put a clip on each. The cystic duct was then opened.  An intraoperative cholangiogram was then Attempted.Sung Amabile catheter was introduced percutaneously andI attempted to place in the cystic duct but was unable to get it to thread. Cystic duct was quite small. She had no known stones and liver functions were normal. I therefore ceased attempts. The catheter was removed and 3 clips placed on the stay side of the  cystic duct. The duct was then divided.  Additional clips are placed on the cystic artery and it was divided. The gallbladder was then removed from below to above the coagulation current of the cautery. It was then placed in a bag to be retrieved later.  The abdomen was irrigated and a check for hemostasis along the bed of the gallbladder made. Once everything appeared to be dry we were able to move the camera to the epigastric port and removed the gallbladder through the umbilical port.  The abdomen was reinsufflated and a final check for hemostasis made. There is no evidence of bleeding or bile leakage. The lateral ports were removed under direct vision and there was no bleeding. The umbilical site was closed with a pursestring, watching with the camera in the epigastric port. The abdomen was then deflated through the epigastric port and that was removed. Skin was closed with 4-0 Monocryl subcuticular and Dermabond.  The patient tolerated the procedure well. There were no operative complications. EBL was minimal. All counts were correct.  Currie Paris, MD, FACS 03/02/2011 12:34 PM

## 2011-03-02 NOTE — Anesthesia Preprocedure Evaluation (Signed)
Anesthesia Evaluation  Patient identified by MRN, date of birth, ID band Patient awake    Reviewed: Allergy & Precautions, H&P , NPO status , Patient's Chart, lab work & pertinent test results  Airway Mallampati: II  Neck ROM: full    Dental   Pulmonary          Cardiovascular     Neuro/Psych PSYCHIATRIC DISORDERS Depression    GI/Hepatic   Endo/Other    Renal/GU      Musculoskeletal   Abdominal   Peds  Hematology   Anesthesia Other Findings   Reproductive/Obstetrics                           Anesthesia Physical Anesthesia Plan  ASA: II  Anesthesia Plan: General   Post-op Pain Management:    Induction: Intravenous  Airway Management Planned: Oral ETT  Additional Equipment:   Intra-op Plan:   Post-operative Plan: Extubation in OR  Informed Consent: I have reviewed the patients History and Physical, chart, labs and discussed the procedure including the risks, benefits and alternatives for the proposed anesthesia with the patient or authorized representative who has indicated his/her understanding and acceptance.     Plan Discussed with: CRNA and Surgeon  Anesthesia Plan Comments:         Anesthesia Quick Evaluation

## 2011-03-02 NOTE — Transfer of Care (Signed)
Immediate Anesthesia Transfer of Care Note  Patient: Heather Mcdonald  Procedure(s) Performed: Procedure(s) (LRB): LAPAROSCOPIC CHOLECYSTECTOMY WITH INTRAOPERATIVE CHOLANGIOGRAM (N/A)  Patient Location: PACU  Anesthesia Type: General  Level of Consciousness: awake, alert , oriented and patient cooperative  Airway & Oxygen Therapy: Patient Spontanous Breathing and Patient connected to nasal cannula oxygen  Post-op Assessment: Report given to PACU RN, Post -op Vital signs reviewed and stable and Patient moving all extremities  Post vital signs: Reviewed and stable  Complications: No apparent anesthesia complications

## 2011-03-03 ENCOUNTER — Encounter (HOSPITAL_COMMUNITY): Payer: Self-pay | Admitting: Surgery

## 2011-03-04 ENCOUNTER — Encounter (INDEPENDENT_AMBULATORY_CARE_PROVIDER_SITE_OTHER): Payer: Self-pay | Admitting: General Surgery

## 2011-03-08 ENCOUNTER — Encounter (INDEPENDENT_AMBULATORY_CARE_PROVIDER_SITE_OTHER): Payer: Self-pay | Admitting: General Surgery

## 2011-03-24 ENCOUNTER — Ambulatory Visit (INDEPENDENT_AMBULATORY_CARE_PROVIDER_SITE_OTHER): Payer: BC Managed Care – PPO | Admitting: Surgery

## 2011-03-24 ENCOUNTER — Encounter (INDEPENDENT_AMBULATORY_CARE_PROVIDER_SITE_OTHER): Payer: Self-pay | Admitting: Surgery

## 2011-03-24 VITALS — BP 120/72 | HR 60 | Temp 97.2°F | Resp 16 | Ht 65.0 in | Wt 147.0 lb

## 2011-03-24 DIAGNOSIS — Z09 Encounter for follow-up examination after completed treatment for conditions other than malignant neoplasm: Secondary | ICD-10-CM

## 2011-03-24 NOTE — Patient Instructions (Signed)
We will see you again on an as needed basis. Please call the office at [phone] if you have any questions or concerns. Thank you for allowing us to take care of you.  

## 2011-03-24 NOTE — Progress Notes (Signed)
[name]       DOB: 07/15/1955           DATE: 03/24/2011       NWG:[id number]   CC: Postop laparoscopic cholecystectomy  HPI:  This patient underwent a laparoscopic cholecystectomy no operative cholangiogram on 03/02/11. She is in for her first postoperative visit. She notes that her incisional pain has resolved. Her preoperative symptoms have improved. She is not having problems with nausea, vomiting, diarrhea, fevers, chills, or urinary symptoms. She is tolerating diet. She feels that she is progressing well and nearly back to normal. PE: General: The patient is alert and appears comfortable, NAD.  Abdomen: Soft and benign. The incisions are healing nicely. There are no apparent problems.  Data reviewed: IOC:  not done Pathology:  Mild cholesterolosis  Impression:  The patient appears to be doing well, with improvement in her symptoms.  Plan:  She may resume full activity and regular diet. She  will followup with [place] on a p.r.n. basis. I did tell her that she may still have some foods that cause indigestion and ask her to call us if there are any questions, problems or concerns.

## 2011-08-22 ENCOUNTER — Encounter (HOSPITAL_COMMUNITY): Payer: Self-pay

## 2011-08-22 ENCOUNTER — Emergency Department (HOSPITAL_COMMUNITY)
Admission: EM | Admit: 2011-08-22 | Discharge: 2011-08-22 | Disposition: A | Discharge status: Home / Self Care | Payer: BC Managed Care – PPO | Source: Home / Self Care | Attending: Emergency Medicine | Admitting: Emergency Medicine

## 2011-08-22 ENCOUNTER — Emergency Department (INDEPENDENT_AMBULATORY_CARE_PROVIDER_SITE_OTHER): Payer: BC Managed Care – PPO

## 2011-08-22 DIAGNOSIS — S92309A Fracture of unspecified metatarsal bone(s), unspecified foot, initial encounter for closed fracture: Secondary | ICD-10-CM

## 2011-08-22 DIAGNOSIS — S92353A Displaced fracture of fifth metatarsal bone, unspecified foot, initial encounter for closed fracture: Secondary | ICD-10-CM

## 2011-08-22 NOTE — ED Provider Notes (Signed)
Chief Complaint  Patient presents with  . Foot Pain    History of Present Illness:   Heather Mcdonald is a 56 year old female who injured her right foot yesterday on a loose paving stone in [place], [place]. She was going up some steps and the paving stone slipped. She turned her foot, but did not hear a pop. Ever since then she's had pain, swelling, and bruising over the fifth metatarsal. She has pain with ambulation and she's not able to bear weight on the foot.  Review of Systems:  Other than noted above, the patient denies any of the following symptoms: Systemic:  No fevers, chills, sweats, or aches.  No fatigue or tiredness. Musculoskeletal:  No joint pain, arthritis, bursitis, swelling, back pain, or neck pain. Neurological:  No muscular weakness, paresthesias, headache, or trouble with speech or coordination.  No dizziness.   PMFSH:  Past medical history, family history, social history, meds, and allergies were reviewed.  Physical Exam:   Vital signs:  BP 129/81  Pulse 74  Temp 98.5 F (36.9 C) (Oral)  Resp 16  SpO2 98% Gen:  Alert and oriented times 3.  In no distress. Musculoskeletal: There is pain to palpation, bruising, and swelling over the entire fifth metatarsal. There was no ankle pain to palpation and no pain over the remainder the metatarsals or phalanges Otherwise, all joints had a full a ROM with no swelling, bruising or deformity.  No edema, pulses full. Extremities were warm and pink.  Capillary refill was brisk.  Skin:  Clear, warm and dry.  No rash. Neuro:  Alert and oriented times 3.  Muscle strength was normal.  Sensation was intact to light touch.   Radiology:  Dg Foot Complete Right  08/22/2011  *RADIOLOGY REPORT*  Clinical Data: Soft tissue swelling and foot pain.  Lateral right foot pain.  RIGHT FOOT COMPLETE - 3+ VIEW  Comparison: None.  Findings: Jones fracture of the right fifth metatarsal is present. This is nondisplaced.  Minimal distraction is present.   Lateral soft tissue swelling is present.  Otherwise the foot appears normal.  IMPRESSION: Right fifth metatarsal base Jones fracture.  Original Report Authenticated By: Andreas Newport, M.D.      Course in Urgent Care Center:   She was placed in a Cam Walker boot and given crutches for ambulation.  Assessment:  The encounter diagnosis was Fracture of 5th metatarsal.  Plan:   1.  The following meds were prescribed:   New Prescriptions   No medications on file   2.  The patient was instructed in symptomatic care, including rest and activity, elevation, application of ice and compression.  Appropriate handouts were given. 3.  The patient was told to return if becoming worse in any way, if no better in 3 or 4 days, and given some red flag symptoms that would indicate earlier return.   4.  The patient was told to follow up with Drs. Eulah Pont and [place] next week.   Reuben Likes, MD 08/22/11 [phone]

## 2011-08-22 NOTE — ED Notes (Signed)
Pt turned rt foot and ankle over yesterday while walking down stairs.

## 2011-10-20 ENCOUNTER — Other Ambulatory Visit: Payer: Self-pay | Admitting: Family Medicine

## 2011-10-20 DIAGNOSIS — Z1231 Encounter for screening mammogram for malignant neoplasm of breast: Secondary | ICD-10-CM

## 2011-10-27 ENCOUNTER — Ambulatory Visit
Admission: RE | Admit: 2011-10-27 | Discharge: 2011-10-27 | Disposition: A | Discharge status: Home / Self Care | Payer: BC Managed Care – PPO | Source: Ambulatory Visit | Attending: Family Medicine | Admitting: Family Medicine

## 2011-10-27 DIAGNOSIS — Z1231 Encounter for screening mammogram for malignant neoplasm of breast: Secondary | ICD-10-CM

## 2011-10-28 ENCOUNTER — Encounter: Payer: Self-pay | Admitting: *Deleted

## 2011-10-28 ENCOUNTER — Encounter: Payer: Self-pay | Admitting: Family Medicine

## 2011-10-28 LAB — HM MAMMOGRAPHY: HM Mammogram: NORMAL

## 2012-02-18 ENCOUNTER — Other Ambulatory Visit (HOSPITAL_COMMUNITY)
Admission: RE | Admit: 2012-02-18 | Discharge: 2012-02-18 | Disposition: A | Discharge status: Home / Self Care | Payer: BC Managed Care – PPO | Source: Ambulatory Visit | Attending: Family Medicine | Admitting: Family Medicine

## 2012-02-18 ENCOUNTER — Encounter: Payer: Self-pay | Admitting: Family Medicine

## 2012-02-18 ENCOUNTER — Ambulatory Visit (INDEPENDENT_AMBULATORY_CARE_PROVIDER_SITE_OTHER): Payer: BC Managed Care – PPO | Admitting: Family Medicine

## 2012-02-18 VITALS — BP 128/68 | HR 70 | Temp 98.4°F | Ht 65.0 in | Wt 150.0 lb

## 2012-02-18 DIAGNOSIS — R87610 Atypical squamous cells of undetermined significance on cytologic smear of cervix (ASC-US): Secondary | ICD-10-CM

## 2012-02-18 DIAGNOSIS — Z01419 Encounter for gynecological examination (general) (routine) without abnormal findings: Secondary | ICD-10-CM | POA: Insufficient documentation

## 2012-02-18 DIAGNOSIS — Z0000 Encounter for general adult medical examination without abnormal findings: Secondary | ICD-10-CM | POA: Insufficient documentation

## 2012-02-18 DIAGNOSIS — Z23 Encounter for immunization: Secondary | ICD-10-CM

## 2012-02-18 NOTE — Progress Notes (Signed)
Subjective:    Patient ID: Heather Mcdonald, female    DOB: March 21, 1955, 57 y.o.   MRN: [id number]  HPI Here for health maintenance exam and to review chronic medical problems    Is doing ok  Had ccy since last visit and also a foot fracture (hiking and rolled ankle stepping on a rock)  Was a jones fracture - did not need surgery , did do PT also   Is not taking calcium and D - knows she should  She does drink mild and also gets outdoors and is not worried about bones Walks regular   Wt is up 3 lb with bmi of 24  Flu vaccine-did get that at work   Td was 04 Wants to get that today   Pap 9/11 - ascus - then one a little over a year ago (per pt)  Was due in fall - back to a yearly protocol for her paps  Micah Flesher to gyn- after her ascus   Mammogram 10/13 Self exam- no lumps or changes   Her insurance will not pay for zostavax until 62  colonosc 3/10  Mood - no depressoin- mood is very good   Labs from [facility]   Lipids total 171 and HDL 63 and LDL 86 with a good ratio a1c is 5.6- good   Patient Active Problem List  Diagnosis  . ALLERGIC RHINITIS  . FIBROCYSTIC BREAST DISEASE  . ASCUS PAP  . Routine general medical examination at a health care facility  . Routine gynecological examination   Past Medical History  Diagnosis Date  . Allergic rhinitis, cause unspecified   . Papanicolaou smear of cervix with atypical squamous cells of undetermined significance (ASC-US)   . Fibrocystic breast disease   . Anxiety disorder   . History of uterine fibroid   . RUQ pain   . Bronchitis 24+yrs ago  . Joint pain   . Gestational diabetes   . Depression   . Gallbladder dyskinesia 01/28/2011  . Foot fracture    Past Surgical History  Procedure Date  . Lipoma excision     from back  . Cervical cryo 1996  . Basal cell removal     from face  . Colonoscopy 04/08/2008    normal  . Breast cyst aspiration 9/11 and 2/12    benign  . Cholecystectomy 03/02/2011    Procedure:  LAPAROSCOPIC CHOLECYSTECTOMY;  Surgeon: Currie Paris, MD;  Location: [facility] OR;  Service: General;  Laterality: N/A;  attempted intraoperative cholangiogram   History  Substance Use Topics  . Smoking status: Never Smoker   . Smokeless tobacco: Never Used  . Alcohol Use: Yes     Comment: socially-couple of times a week   Family History  Problem Relation Age of Onset  . Diabetes type II Father   . Hypertension Father   . Cancer Father     Bladder  . Hypertension Mother   . Colon polyps Mother   . Anesthesia problems Mother   . Breast cancer Maternal Grandmother     ? female cancer  . Cancer Maternal Grandmother     breast  . Hypotension Neg Hx   . Malignant hyperthermia Neg Hx   . Pseudochol deficiency Neg Hx    Allergies  Allergen Reactions  . Penicillins     REACTION: rash   No current outpatient prescriptions on file prior to visit.    Review of Systems Review of Systems  Constitutional: Negative for fever, appetite change, fatigue and  unexpected weight change.  Eyes: Negative for pain and visual disturbance.  Respiratory: Negative for cough and shortness of breath.   Cardiovascular: Negative for cp or palpitations    Gastrointestinal: Negative for nausea, diarrhea and constipation.  Genitourinary: Negative for urgency and frequency.  Skin: Negative for pallor or rash   Neurological: Negative for weakness, light-headedness, numbness and headaches.  Hematological: Negative for adenopathy. Does not bruise/bleed easily.  Psychiatric/Behavioral: Negative for dysphoric mood. The patient is not nervous/anxious.         Objective:   Physical Exam  Constitutional: She appears well-developed and well-nourished. No distress.  HENT:  Head: Normocephalic and atraumatic.  Right Ear: External ear normal.  Left Ear: External ear normal.  Nose: Nose normal.  Mouth/Throat: Oropharynx is clear and moist.  Eyes: Conjunctivae normal and EOM are normal. Pupils are equal, round,  and reactive to light. Right eye exhibits no discharge. Left eye exhibits no discharge. No scleral icterus.  Neck: Normal range of motion. Neck supple. No JVD present. Carotid bruit is not present. No thyromegaly present.  Cardiovascular: Normal rate, regular rhythm, normal heart sounds and intact distal pulses.  Exam reveals no gallop.   Pulmonary/Chest: Effort normal and breath sounds normal. No respiratory distress. She has no wheezes. She has no rales.  Abdominal: Soft. Bowel sounds are normal. She exhibits no distension, no abdominal bruit and no mass. There is no tenderness.  Genitourinary: Vagina normal and uterus normal. No breast swelling, discharge or bleeding. There is no rash, tenderness or lesion on the right labia. There is no rash, tenderness or lesion on the left labia. Uterus is not enlarged and not tender. Cervix exhibits no motion tenderness, no discharge and no friability. Right adnexum displays no mass, no tenderness and no fullness. Left adnexum displays no mass, no tenderness and no fullness. No bleeding around the vagina. No vaginal discharge found.       Breast exam: No mass, nodules, thickening, tenderness, bulging, retraction, inflamation, nipple discharge or skin changes noted.  No axillary or clavicular LA.  Chaperoned exam.    Musculoskeletal: She exhibits no edema and no tenderness.  Lymphadenopathy:    She has no cervical adenopathy.  Neurological: She is alert. She has normal reflexes. No cranial nerve deficit. She exhibits normal muscle tone. Coordination normal.  Skin: Skin is warm and dry. No rash noted. No erythema. No pallor.  Psychiatric: She has a normal mood and affect.          Assessment & Plan:

## 2012-02-18 NOTE — Patient Instructions (Signed)
Tdap = tetanus shot today  If you are interested in an early bone density test - find out if your insurance covers it  Try to get 1200-1500 mg of calcium per day with at least 1000 iu of vitamin D - for bone health  Also keep walking

## 2012-02-18 NOTE — Assessment & Plan Note (Signed)
Reviewed health habits including diet and exercise and skin cancer prevention Also reviewed health mt list, fam hx and immunizations  Labs reviewed from [facility] incl good chol

## 2012-02-18 NOTE — Assessment & Plan Note (Signed)
This cleared  On yearly schedule now  Pap today

## 2012-02-18 NOTE — Assessment & Plan Note (Signed)
With pap  Last ascus pap in 2011 Doing well with menopause Disc imp of ca and D for bone health

## 2012-02-22 ENCOUNTER — Encounter: Payer: Self-pay | Admitting: Family Medicine

## 2012-02-22 ENCOUNTER — Encounter: Payer: Self-pay | Admitting: *Deleted

## 2012-05-01 ENCOUNTER — Encounter: Payer: BC Managed Care – PPO | Admitting: Family Medicine

## 2012-10-03 ENCOUNTER — Other Ambulatory Visit: Payer: Self-pay

## 2012-10-03 DIAGNOSIS — Z1231 Encounter for screening mammogram for malignant neoplasm of breast: Secondary | ICD-10-CM

## 2012-11-09 ENCOUNTER — Ambulatory Visit
Admission: RE | Admit: 2012-11-09 | Discharge: 2012-11-09 | Disposition: A | Discharge status: Home / Self Care | Payer: BC Managed Care – PPO | Source: Ambulatory Visit

## 2012-11-09 DIAGNOSIS — Z1231 Encounter for screening mammogram for malignant neoplasm of breast: Secondary | ICD-10-CM

## 2012-11-14 ENCOUNTER — Other Ambulatory Visit: Payer: Self-pay | Admitting: Family Medicine

## 2012-11-14 DIAGNOSIS — R928 Other abnormal and inconclusive findings on diagnostic imaging of breast: Secondary | ICD-10-CM

## 2012-11-30 ENCOUNTER — Ambulatory Visit
Admission: RE | Admit: 2012-11-30 | Discharge: 2012-11-30 | Disposition: A | Discharge status: Home / Self Care | Payer: BC Managed Care – PPO | Source: Ambulatory Visit | Attending: Family Medicine | Admitting: Family Medicine

## 2012-11-30 DIAGNOSIS — R928 Other abnormal and inconclusive findings on diagnostic imaging of breast: Secondary | ICD-10-CM

## 2013-02-19 ENCOUNTER — Encounter: Payer: Self-pay | Admitting: Family Medicine

## 2013-02-19 ENCOUNTER — Ambulatory Visit (INDEPENDENT_AMBULATORY_CARE_PROVIDER_SITE_OTHER): Payer: BC Managed Care – PPO | Admitting: Family Medicine

## 2013-02-19 VITALS — BP 130/80 | HR 69 | Temp 98.3°F | Ht 65.0 in | Wt 153.5 lb

## 2013-02-19 DIAGNOSIS — G4733 Obstructive sleep apnea (adult) (pediatric): Secondary | ICD-10-CM

## 2013-02-19 DIAGNOSIS — R0609 Other forms of dyspnea: Secondary | ICD-10-CM

## 2013-02-19 DIAGNOSIS — R5381 Other malaise: Secondary | ICD-10-CM

## 2013-02-19 DIAGNOSIS — R5383 Other fatigue: Secondary | ICD-10-CM | POA: Insufficient documentation

## 2013-02-19 DIAGNOSIS — R0683 Snoring: Secondary | ICD-10-CM

## 2013-02-19 DIAGNOSIS — R0989 Other specified symptoms and signs involving the circulatory and respiratory systems: Secondary | ICD-10-CM

## 2013-02-19 DIAGNOSIS — Z0000 Encounter for general adult medical examination without abnormal findings: Secondary | ICD-10-CM

## 2013-02-19 DIAGNOSIS — M546 Pain in thoracic spine: Secondary | ICD-10-CM | POA: Insufficient documentation

## 2013-02-19 HISTORY — DX: Obstructive sleep apnea (adult) (pediatric): G47.33

## 2013-02-19 NOTE — Assessment & Plan Note (Signed)
With day time somnolence  Suspect poss sleep apnea- disc health implications of this incl cardiovasc risk Pt was ref to pulmonary sleep clinic for further eval

## 2013-02-19 NOTE — Assessment & Plan Note (Signed)
Ongoing - has had ccy with no imp/ is sometimes positional / thinks it is muscular  No neuro symptoms Will make appt for eval with Dr Lorelei Pont

## 2013-02-19 NOTE — Assessment & Plan Note (Signed)
Day time somnoence with loud snoring at night Ref to pulm sleep clinic for sleep apnea eval

## 2013-02-19 NOTE — Progress Notes (Signed)
Subjective:    Patient ID: Heather Mcdonald, female    DOB: 16-Jul-1955, 58 y.o.   MRN: [id number]  HPI Here for health maintenance exam and to review chronic medical problems    She was worried about her bp -not high- but higher than when she was younger  Taking care of herself   Flu vaccine - had one  Mammogram 11/14- with 6 mo f/u due to calcifications  Self exam -no lumps / but her L breast has always been more sensitive (had cysts in it in the past)   Pap 2/14 nl  Hx of ascus in the past - remote /no hx of cancer  Last 3 in a row have been normal  No gyn problems at all  Menopause has not been so hard on her yet  She is ok with not doing pap every year   colonosc 3/10- 10 year follow up and no fam hx Td 1/14  Labs from [facility]- reviewed with pt today Nl cmet  Lipids good with HDl 64 and LDL 89- very good  Diet- she generally eats what she wants to - but does not 'go crazy'  Wt is up 1 lb with bmi of 25  A1C was 5.6- good  Strong family hx   She exercises- cardio and wt lifting - very good about that   She still has a lot of pain in R side - thinks it is muscular  Tension/ car ride / positional  Had ccy in the past     Thinks she may have sleep apnea  Loud snoring - recorded herself  husban cannot stay in the same bed  Wakes up tired Stays sleepy all the time  She is interested in work up for sleep apnea   Patient Active Problem List   Diagnosis Date Noted  . Snoring 02/19/2013  . Fatigue 02/19/2013  . Routine general medical examination at a health care facility 02/18/2012  . Routine gynecological examination 02/18/2012  . ASCUS PAP 10/20/2009  . ALLERGIC RHINITIS 12/22/2006  . FIBROCYSTIC BREAST DISEASE 12/22/2006   Past Medical History  Diagnosis Date  . Allergic rhinitis, cause unspecified   . Papanicolaou smear of cervix with atypical squamous cells of undetermined significance (ASC-US)   . Fibrocystic breast disease   . Anxiety disorder   .  History of uterine fibroid   . RUQ pain   . Bronchitis 24+yrs ago  . Joint pain   . Gestational diabetes   . Depression   . Gallbladder dyskinesia 01/28/2011  . Foot fracture    Past Surgical History  Procedure Laterality Date  . Lipoma excision      from back  . Cervical cryo  1996  . Basal cell removal      from face  . Colonoscopy  04/08/2008    normal  . Breast cyst aspiration  9/11 and 2/12    benign  . Cholecystectomy  03/02/2011    Procedure: LAPAROSCOPIC CHOLECYSTECTOMY;  Surgeon: Haywood Lasso, MD;  Location: [facility];  Service: General;  Laterality: N/A;  attempted intraoperative cholangiogram   History  Substance Use Topics  . Smoking status: Never Smoker   . Smokeless tobacco: Never Used  . Alcohol Use: Yes     Comment: socially-couple of times a week   Family History  Problem Relation Age of Onset  . Diabetes type II Father   . Hypertension Father   . Cancer Father     Bladder  . Hypertension  Mother   . Colon polyps Mother   . Anesthesia problems Mother   . Breast cancer Maternal Grandmother     ? female cancer  . Cancer Maternal Grandmother     breast  . Hypotension Neg Hx   . Malignant hyperthermia Neg Hx   . Pseudochol deficiency Neg Hx    Allergies  Allergen Reactions  . Penicillins     REACTION: rash   No current outpatient prescriptions on file prior to visit.   No current facility-administered medications on file prior to visit.       Review of Systems Review of Systems  Constitutional: Negative for fever, appetite change, fatigue and unexpected weight change.  Eyes: Negative for pain and visual disturbance.  Respiratory: Negative for cough and shortness of breath.   Cardiovascular: Negative for cp or palpitations    Gastrointestinal: Negative for nausea, diarrhea and constipation.  Genitourinary: Negative for urgency and frequency.  Skin: Negative for pallor or rash   MSK pos for chronic R sided back /side pain Neurological:  Negative for weakness, light-headedness, numbness and headaches.  Hematological: Negative for adenopathy. Does not bruise/bleed easily.  Psychiatric/Behavioral: Negative for dysphoric mood. The patient is not nervous/anxious.  pos for fatigue and loud snoring        Objective:   Physical Exam  Constitutional: She appears well-developed and well-nourished. No distress.  HENT:  Head: Normocephalic and atraumatic.  Right Ear: External ear normal.  Left Ear: External ear normal.  Mouth/Throat: Oropharynx is clear and moist.  Eyes: Conjunctivae and EOM are normal. Pupils are equal, round, and reactive to light. No scleral icterus.  Neck: Normal range of motion. Neck supple. No JVD present. Carotid bruit is not present. No thyromegaly present.  Cardiovascular: Normal rate, regular rhythm, normal heart sounds and intact distal pulses.  Exam reveals no gallop.   Pulmonary/Chest: Effort normal and breath sounds normal. No respiratory distress. She has no wheezes. She exhibits no tenderness.  Abdominal: Soft. Bowel sounds are normal. She exhibits no distension, no abdominal bruit and no mass. There is no tenderness.  Genitourinary: No breast swelling, tenderness, discharge or bleeding.  Breast exam: No mass, nodules, thickening, tenderness, bulging, retraction, inflamation, nipple discharge or skin changes noted.  No axillary or clavicular LA.   Musculoskeletal: Normal range of motion. She exhibits no edema and no tenderness.  No spinal tenderness today  Lymphadenopathy:    She has no cervical adenopathy.  Neurological: She is alert. She has normal reflexes. No cranial nerve deficit. She exhibits normal muscle tone. Coordination normal.  Skin: Skin is warm and dry. No rash noted. No erythema. No pallor.  Psychiatric: She has a normal mood and affect.          Assessment & Plan:

## 2013-02-19 NOTE — Assessment & Plan Note (Signed)
Reviewed health habits including diet and exercise and skin cancer prevention Reviewed appropriate screening tests for age  Also reviewed health mt list, fam hx and immunization status , as well as social and family history   Labs reviewed  Good exercise habits

## 2013-02-19 NOTE — Progress Notes (Signed)
Pre-visit discussion using our clinic review tool. No additional management support is needed unless otherwise documented below in the visit note.  

## 2013-02-19 NOTE — Patient Instructions (Addendum)
We will refer you to sleep clinic at check out  Also make an appt. With Dr Lorelei Pont at check out to address your side pain  Take care of yourself  Try to eat healthy and keep exercising  Try to get 1200-1500 mg of calcium per day with at least 1000 iu of vitamin D - for bone health

## 2013-02-28 ENCOUNTER — Ambulatory Visit (INDEPENDENT_AMBULATORY_CARE_PROVIDER_SITE_OTHER): Payer: BC Managed Care – PPO | Admitting: Family Medicine

## 2013-02-28 ENCOUNTER — Encounter: Payer: Self-pay | Admitting: Family Medicine

## 2013-02-28 VITALS — BP 128/78 | HR 79 | Temp 98.1°F | Ht 65.0 in | Wt 154.5 lb

## 2013-02-28 DIAGNOSIS — IMO0002 Reserved for concepts with insufficient information to code with codable children: Secondary | ICD-10-CM

## 2013-02-28 DIAGNOSIS — S29012A Strain of muscle and tendon of back wall of thorax, initial encounter: Secondary | ICD-10-CM

## 2013-02-28 DIAGNOSIS — R279 Unspecified lack of coordination: Secondary | ICD-10-CM

## 2013-02-28 DIAGNOSIS — S39011A Strain of muscle, fascia and tendon of abdomen, initial encounter: Secondary | ICD-10-CM

## 2013-02-28 DIAGNOSIS — S239XXA Sprain of unspecified parts of thorax, initial encounter: Secondary | ICD-10-CM

## 2013-02-28 DIAGNOSIS — G2589 Other specified extrapyramidal and movement disorders: Secondary | ICD-10-CM

## 2013-02-28 NOTE — Progress Notes (Signed)
Date:  02/28/2013   Name:  Heather Mcdonald   DOB:  05-21-1955   MRN:  [id number] Gender: female Age: 58 y.o.  Primary Physician: Loura Pardon, MD  Dear Dr. Glori Bickers,  Thank you for having me see Heather Mcdonald in consultation today at [facility] at [facility] for her problem with R side / flank pain.  As you may recall, she is a 58 y.o. year old female with a history of R side pain for around 5 years, and she previously had a cholecystectomy when it was felt to be from her gallbladder.    Right side pain, 4-5 years ago. Initially thought maybe gallbladder, and now still has some pain. Bad when on an airplane. 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 Notices with ab work.  Some squats.   Burning pain in L 1st MTP. This is newer than the other problems, and she also asks for my suggestions here.   Past Medical History  Diagnosis Date  . Allergic rhinitis, cause unspecified   . Papanicolaou smear of cervix with atypical squamous cells of undetermined significance (ASC-US)   . Fibrocystic breast disease   . Anxiety disorder   . History of uterine fibroid   . RUQ pain   . Bronchitis 24+yrs ago  . Joint pain   . Gestational diabetes   . Depression   . Gallbladder dyskinesia 01/28/2011  . Foot fracture     Past Surgical History  Procedure Laterality Date  . Lipoma excision      from back  . Cervical cryo  1996  . Basal cell removal      from face  . Colonoscopy  04/08/2008    normal  . Breast cyst aspiration  9/11 and 2/12    benign  . Cholecystectomy  03/02/2011    Procedure: LAPAROSCOPIC CHOLECYSTECTOMY;  Surgeon: Haywood Lasso, MD;  Location: [facility];  Service: General;  Laterality: N/A;  attempted intraoperative cholangiogram    History   Social History  . Marital Status: Married   Spouse Name: N/A    Number of Children: 2  . Years of Education: N/A   Occupational History  . Paramedic    Social History Main Topics  . Smoking status: Never Smoker   . Smokeless tobacco: Never Used  . Alcohol Use: Yes     Comment: socially-couple of times a week  . Drug Use: No  . Sexual Activity: Yes   Other Topics Concern  . None   Social History Narrative   Husband with systemic autoimmune disease, works in [place]    Family History  Problem Relation Age of Onset  . Diabetes type II Father   . Hypertension Father   . Cancer Father     Bladder  . Hypertension Mother   . Colon polyps Mother   . Anesthesia problems Mother   . Breast cancer Maternal Grandmother     ? female cancer  . Cancer Maternal Grandmother     breast  . Hypotension Neg Hx   . Malignant hyperthermia Neg Hx   . Pseudochol deficiency Neg Hx     Medications and Allergies reviewed  Review of Systems:    GEN: No fevers, chills. Nontoxic. Primarily MSK c/o today. MSK: Detailed in the HPI  GI: tolerating PO intake without difficulty Neuro: No numbness, parasthesias, or tingling associated. Otherwise the pertinent positives of the ROS are noted above.   Physical Examination: BP 128/78  Pulse 79  Temp(Src) 98.1 F (36.7 C) (Oral)  Ht 5\' 5"  (1.651 m)  Wt 154 lb 8 oz (70.081 kg)  BMI 25.71 kg/m2  SpO2 95%  LMP 01/19/2010    GEN: Well-developed,well-nourished,in no acute distress; alert,appropriate and cooperative throughout examination HEENT: Normocephalic and atraumatic without obvious abnormalities. Ears, externally no deformities PULM: Breathing comfortably in no respiratory distress EXT: No clubbing, cyanosis, or edema PSYCH: Normally interactive. Cooperative during the interview. Pleasant. Friendly and conversant. Not anxious or depressed appearing. Normal, full affect.  CERVICAL SPINE EXAM Range of motion: Flexion, extension, lateral bending, and rotation: full Pain with  terminal motion: full Spinous Processes: NT SCM: NT Upper paracervical muscles: grossly normal Upper traps: NT C5-T1 intact, sensation and motor  ABD: S, NT, ND, + BS, No rebound, No HSM   Along lower rhomboid, traps and both anterior and posterior serratus, she is tender to palpation.   FEET: L Echymosis: no Edema: no ROM: full LE B Gait: heel toe, non-antalgic MT pain: no Callus pattern: none Lateral Mall: NT Medial Mall: NT Talus: NT Navicular: NT Cuboid: NT Calcaneous: NT Metatarsals: NT 5th MT: NT Phalanges: NT Achilles: NT Plantar Fascia: NT Fat Pad: NT Peroneals: NT Post Tib: NT Great Toe: APPROX 15 DEG LOSS OF MOTION Ant Drawer: neg ATFL: NT CFL: NT Deltoid: NT Other foot breakdown: none Long arch: preserved Transverse arch: MILD Hindfoot breakdown: none Sensation: intact   Objective Data: NUCLEAR MEDICINE HEPATOBILIARY IMAGING WITH GALLBLADDER EF  Technique: Sequential images of the abdomen were obtained out to  60 minutes following intravenous administration of  radiopharmaceutical. After slow intravenous infusion of 1.2  micrograms Cholecystokinin, gallbladder ejection fraction was  determined.  Radiopharmaceutical: 5.2 mCi Tc-54m Choletec  Comparison: None.  Findings: Prompt radiopharmaceutical uptake by the liver is seen.  Liver is normal in appearance. There is also prompt biliary  excretion of activity.  Biliary activity reaches the small bowel initially on the 15-minute  image. Gallbladder activity is seen initially on the 30-minute  image.  Following slow intravenous infusion of the cholecystokinin, the  gallbladder ejection fraction reaches a maximum of only 10%, which  is below the normal value of greater than 30%.  The patient did not report symptoms during CCK infusion.  IMPRESSION:  1. No evidence of cystic duct or biliary obstruction.  2. Abnormal decreased gallbladder ejection fraction of 10%.  Original Report Authenticated By:  Marlaine Hind, M.D.  RADIOLOGY REPORT*  Clinical Data: Right upper quadrant pain  ABDOMINAL ULTRASOUND COMPLETE  Comparison: None.  Findings:  Gallbladder: No gallstones, gallbladder wall thickening, or  pericholecystic fluid.  Common Bile Duct: Within normal limits in caliber.  Liver: No focal mass lesion identified. Within normal limits in  parenchymal echogenicity.  IVC: Appears normal.  Pancreas: No abnormality identified.  Spleen: Within normal limits in size and echotexture.  Right kidney: Normal in size and parenchymal echogenicity. No  evidence of mass or hydronephrosis.  Left kidney: Normal in size and parenchymal echogenicity. No  evidence of mass or hydronephrosis.  Abdominal Aorta: No aneurysm identified.  IMPRESSION:  Negative abdominal ultrasound.  Original Report Authenticated By: Angelita Ingles, M.D.  Assessment and Plan: 1. Pain in serratus anterior and posterior, lower rhomboid and trap on the right 2. Scapular dyskinesis and poor postural control - both likely contribute  and may be getting fatigue while sitting at her computer 3. 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 Formal PT for core and scapular conditioning. I also reviewed home rehab with her in the office. 2. Anticipate multiple months for recovery, but I would be optimistic about considerable improvement. 3. Placed Hapad MT pad  We will see the patient back in 2 mo.  Thank you for having [place] see Heather Mcdonald in consultation.  Feel free to contact me with any questions.  Signed,  Maud Deed. Arlean Thies, MD, [place] [facility] [address] [place], [place] [address] Phone: [phone] Fax: [phone]

## 2013-02-28 NOTE — Progress Notes (Signed)
Pre-visit discussion using our clinic review tool. No additional management support is needed unless otherwise documented below in the visit note.  

## 2013-03-14 ENCOUNTER — Encounter: Payer: Self-pay | Admitting: Pulmonary Disease

## 2013-03-14 ENCOUNTER — Ambulatory Visit (INDEPENDENT_AMBULATORY_CARE_PROVIDER_SITE_OTHER): Payer: BC Managed Care – PPO | Admitting: Pulmonary Disease

## 2013-03-14 VITALS — BP 122/82 | HR 80

## 2013-03-14 DIAGNOSIS — R0609 Other forms of dyspnea: Secondary | ICD-10-CM

## 2013-03-14 DIAGNOSIS — R0989 Other specified symptoms and signs involving the circulatory and respiratory systems: Secondary | ICD-10-CM

## 2013-03-14 DIAGNOSIS — R0683 Snoring: Secondary | ICD-10-CM

## 2013-03-14 NOTE — Progress Notes (Signed)
Chief Complaint  Patient presents with  . Sleep Consult    referred by Dr. Glori Bickers for sleep apnea. Epworth Score: 8.    History of Present Illness: Heather Mcdonald is a 58 y.o. female for evaluation of sleep problems.  She has noticed trouble with her sleep for years.  She went through menopause about 3 years ago, and her symptoms may have gotten worse since then.  Her husband has been concerned about her snoring, and sleeps in a separate room.  Her sister told her she makes funny noises while asleep.  She feels tired during the day.  She cut down using her iPad at night, and this helped some.  However, she is still having difficulty.  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  She falls asleep after 10 minutes.  She wakes up two times to use the bathroom.  She gets out of bed at 6 am on weekdays, and 7 am on weekends.  She feels tired in the morning.  She denies morning headache.  She does not use anything to help her fall sleep.  She drinks a cup of coffee in the morning.  She has trouble with TMJ, and this is monitored by her dentist.  She denies sleep walking, sleep talking, or nightmares.  There is no history of restless legs.  She denies sleep hallucinations, sleep paralysis, or cataplexy.  She used to have problems with panic attacks, but this has resolved.  She denies history of depression.  The Epworth score is 8 out of 24.   [name]  has a past medical history of Allergic rhinitis; Papanicolaou smear of cervix with atypical squamous cells of undetermined significance (ASC-US); Fibrocystic breast disease; Anxiety disorder; History of uterine fibroid; RUQ pain; Bronchitis (24+yrs ago); Joint pain; Gestational diabetes; Gallbladder dyskinesia (01/28/2011); Foot fracture; and Temporomandibular joint disorder (TMJ).  [name]  has past surgical history that includes Lipoma excision; cervical cryo (1996); basal cell removal; Colonoscopy (04/08/2008); Breast cyst aspiration (9/11 and 2/12); and Cholecystectomy (03/02/2011).  Prior to Admission medications   Not on File    Allergies  Allergen Reactions  . Penicillins     REACTION: rash    Her family history includes Anesthesia problems in her mother; Breast cancer in her maternal grandmother; Cancer in her father and maternal grandmother; Colon polyps in her mother; Diabetes type II in her father; Hypertension in her father and mother. There is no history of Hypotension, Malignant hyperthermia, or Pseudochol deficiency.  She  reports that she has never smoked. She has never used smokeless tobacco. She reports that she drinks alcohol. She reports that she does not use illicit drugs.  Review of Systems  Constitutional: Negative for fever, chills, diaphoresis, activity change, appetite change, fatigue and unexpected weight change.  HENT: Positive for postnasal drip. Negative for congestion, dental problem, ear discharge, ear pain, facial swelling, hearing loss, mouth sores, nosebleeds, rhinorrhea, sinus pressure, sneezing, sore throat, tinnitus, trouble swallowing and voice change.   Eyes: Negative for photophobia, discharge, itching and visual disturbance.  Respiratory: Negative for apnea, cough, choking, chest tightness, shortness of breath, wheezing and stridor.   Cardiovascular: Negative for chest pain, palpitations and leg swelling.  Gastrointestinal: Negative for nausea, vomiting, abdominal pain, constipation, blood in stool and abdominal distention.  Genitourinary: Negative for  dysuria, urgency, frequency, hematuria, flank pain, decreased urine volume and difficulty urinating.  Musculoskeletal: Negative for arthralgias, back pain, gait problem, joint swelling, myalgias, neck pain and neck stiffness.  Skin: Negative for color change, pallor and rash.   Neurological: Negative for dizziness, tremors, seizures, syncope, speech difficulty, weakness, light-headedness, numbness and headaches.  Hematological: Negative for adenopathy. Does not bruise/bleed easily.  Psychiatric/Behavioral: Negative for confusion, sleep disturbance and agitation. The patient is not nervous/anxious.    Physical Exam:  General - No distress ENT - No sinus tenderness, no oral exudate, no LAN, no thyromegaly, TM clear, pupils equal/reactive, MP 4, scalloped tongue, TMJ click, elongated uvula Cardiac - s1s2 regular, no murmur, pulses symmetric Chest - No wheeze/rales/dullness, good air entry, normal respiratory excursion Back - No focal tenderness Abd - Soft, non-tender, no organomegaly, + bowel sounds Ext - No edema Neuro - Normal strength, cranial nerves intact Skin - No rashes Psych - Normal mood, and behavior  Assessment/plan:  Chesley Mires, MD [place] 03/14/2013, 9:24 AM Pager:  [phone]

## 2013-03-14 NOTE — Patient Instructions (Signed)
Will arrange for sleep study Will call to arrange for follow up after sleep study reviewed 

## 2013-03-14 NOTE — Assessment & Plan Note (Signed)
She reports snoring, sleep disruption, and daytime sleepiness.  I am concerned she could have sleep apnea.  She has a history of TMJ disease.  We discussed how sleep apnea can affect various health problems including risks for hypertension, cardiovascular disease, and diabetes.  We also discussed how sleep disruption can increase risks for accident, such as while driving.  Weight loss as a means of improving sleep apnea was also reviewed.  Additional treatment options discussed were CPAP therapy, oral appliance, and surgical intervention.  To further assess will arrange for in lab sleep study.

## 2013-03-14 NOTE — Progress Notes (Deleted)
   Subjective:    Patient ID: Heather Mcdonald, female    DOB: 1955-08-05, 58 y.o.   MRN: [id number]  HPI    Review of Systems  Constitutional: Negative for fever, chills, diaphoresis, activity change, appetite change, fatigue and unexpected weight change.  HENT: Positive for postnasal drip. Negative for congestion, dental problem, ear discharge, ear pain, facial swelling, hearing loss, mouth sores, nosebleeds, rhinorrhea, sinus pressure, sneezing, sore throat, tinnitus, trouble swallowing and voice change.   Eyes: Negative for photophobia, discharge, itching and visual disturbance.  Respiratory: Negative for apnea, cough, choking, chest tightness, shortness of breath, wheezing and stridor.   Cardiovascular: Negative for chest pain, palpitations and leg swelling.  Gastrointestinal: Negative for nausea, vomiting, abdominal pain, constipation, blood in stool and abdominal distention.  Genitourinary: Negative for dysuria, urgency, frequency, hematuria, flank pain, decreased urine volume and difficulty urinating.  Musculoskeletal: Negative for arthralgias, back pain, gait problem, joint swelling, myalgias, neck pain and neck stiffness.  Skin: Negative for color change, pallor and rash.  Neurological: Negative for dizziness, tremors, seizures, syncope, speech difficulty, weakness, light-headedness, numbness and headaches.  Hematological: Negative for adenopathy. Does not bruise/bleed easily.  Psychiatric/Behavioral: Negative for confusion, sleep disturbance and agitation. The patient is not nervous/anxious.        Objective:   Physical Exam        Assessment & Plan:

## 2013-04-03 ENCOUNTER — Encounter: Payer: Self-pay | Admitting: Family Medicine

## 2013-04-03 ENCOUNTER — Ambulatory Visit (INDEPENDENT_AMBULATORY_CARE_PROVIDER_SITE_OTHER): Payer: BC Managed Care – PPO | Admitting: Family Medicine

## 2013-04-03 VITALS — BP 132/88 | HR 92 | Temp 98.6°F | Ht 65.0 in | Wt 152.8 lb

## 2013-04-03 DIAGNOSIS — R1013 Epigastric pain: Secondary | ICD-10-CM | POA: Insufficient documentation

## 2013-04-03 DIAGNOSIS — R195 Other fecal abnormalities: Secondary | ICD-10-CM

## 2013-04-03 LAB — BASIC METABOLIC PANEL
BUN: 13 mg/dL (ref 6–23)
CALCIUM: 9.4 mg/dL (ref 8.4–10.5)
CO2: 30 mEq/L (ref 19–32)
Chloride: 102 mEq/L (ref 96–112)
Creatinine, Ser: 1 mg/dL (ref 0.4–1.2)
GFR: 62.56 mL/min (ref >60.00)
GLUCOSE: 105 mg/dL — AB (ref 70–99)
Potassium: 4.1 mEq/L (ref 3.5–5.1)
SODIUM: 138 meq/L (ref 135–145)

## 2013-04-03 LAB — CBC WITH DIFFERENTIAL/PLATELET
BASOS ABS: 0 10*3/uL (ref 0.0–0.1)
BASOS PCT: 0.6 % (ref 0.0–3.0)
EOS ABS: 0.1 10*3/uL (ref 0.0–0.7)
Eosinophils Relative: 2.2 % (ref 0.0–5.0)
HCT: 41.2 % (ref 36.0–46.0)
Hemoglobin: 13.8 g/dL (ref 12.0–15.0)
LYMPHS PCT: 26.2 % (ref 12.0–46.0)
Lymphs Abs: 1.6 10*3/uL (ref 0.7–4.0)
MCHC: 33.5 g/dL (ref 30.0–36.0)
MCV: 89.3 fl (ref 78.0–100.0)
MONO ABS: 0.3 10*3/uL (ref 0.1–1.0)
Monocytes Relative: 5.6 % (ref 3.0–12.0)
NEUTROS PCT: 65.4 % (ref 43.0–77.0)
Neutro Abs: 3.9 10*3/uL (ref 1.4–7.7)
PLATELETS: 230 10*3/uL (ref 150.0–400.0)
RBC: 4.61 Mil/uL (ref 3.87–5.11)
RDW: 13 % (ref 11.5–14.6)
WBC: 6 10*3/uL (ref 4.5–10.5)

## 2013-04-03 LAB — HEPATIC FUNCTION PANEL
ALK PHOS: 57 U/L (ref 39–117)
ALT: 16 U/L (ref 0–35)
AST: 14 U/L (ref 0–37)
Albumin: 4.2 g/dL (ref 3.5–5.2)
BILIRUBIN TOTAL: 0.5 mg/dL (ref 0.3–1.2)
Bilirubin, Direct: 0 mg/dL (ref 0.0–0.3)
TOTAL PROTEIN: 7.5 g/dL (ref 6.0–8.3)

## 2013-04-03 LAB — LIPASE: LIPASE: 32 U/L (ref 11.0–59.0)

## 2013-04-03 NOTE — Patient Instructions (Signed)
Labs today  Keep track of diet and stool habits  If pain in stomach continues -try zantac 75 -150 mg up to twice daily  Let's see how things look and go from there - we may want to consider an imaging study or GI referral (Dr Leroy Kennedy)  Update me if symptoms worsen

## 2013-04-03 NOTE — Progress Notes (Signed)
Pre visit review using our clinic review tool, if applicable. No additional management support is needed unless otherwise documented below in the visit note. 

## 2013-04-03 NOTE — Progress Notes (Signed)
Subjective:    Patient ID: Heather Mcdonald, female    DOB: 01/14/1956, 58 y.o.   MRN: [id number]  HPI Here for stool change   In the past 1-2 weeks she has had clay colored stools and this really worries her  This happens several times per week   No blood in her stool at all   No jaundice  No hx of elevated bilirubin  Had ccy in the past   colonosc 3/10 normal   Did drink 4 beers one night and ate oysters Still has a "stich" in her side - R and it will not go away - it is in her R flank  Improved with stretches but not gone  Does not correlate with eating  Worse when riding in the car   Alcohol-does not drink every day- usually 1-2 drinks three times maximum per week   No n/v  Patient Active Problem List   Diagnosis Date Noted  . Clay-colored stools 04/03/2013  . Abdominal pain, epigastric 04/03/2013  . Snoring 02/19/2013  . Fatigue 02/19/2013  . Right-sided thoracic back pain 02/19/2013  . Routine general medical examination at a health care facility 02/18/2012  . Routine gynecological examination 02/18/2012  . ALLERGIC RHINITIS 12/22/2006  . FIBROCYSTIC BREAST DISEASE 12/22/2006   Past Medical History  Diagnosis Date  . Allergic rhinitis   . Papanicolaou smear of cervix with atypical squamous cells of undetermined significance (ASC-US)   . Fibrocystic breast disease   . Anxiety disorder   . History of uterine fibroid   . RUQ pain   . Bronchitis 24+yrs ago  . Joint pain   . Gestational diabetes   . Gallbladder dyskinesia 01/28/2011  . Foot fracture   . Temporomandibular joint disorder (TMJ)    Past Surgical History  Procedure Laterality Date  . Lipoma excision      from back  . Cervical cryo  1996  . Basal cell removal      from face  . Colonoscopy  04/08/2008    normal  . Breast cyst aspiration  9/11 and 2/12    benign  . Cholecystectomy  03/02/2011    Procedure: LAPAROSCOPIC CHOLECYSTECTOMY;  Surgeon: Haywood Lasso, MD;  Location: [facility];   Service: General;  Laterality: N/A;  attempted intraoperative cholangiogram   History  Substance Use Topics  . Smoking status: Never Smoker   . Smokeless tobacco: Never Used  . Alcohol Use: Yes     Comment: socially-couple of times a week   Family History  Problem Relation Age of Onset  . Diabetes type II Father   . Hypertension Father   . Cancer Father     Bladder  . Hypertension Mother   . Colon polyps Mother   . Anesthesia problems Mother   . Breast cancer Maternal Grandmother     ? female cancer  . Cancer Maternal Grandmother     breast  . Hypotension Neg Hx   . Malignant hyperthermia Neg Hx   . Pseudochol deficiency Neg Hx    Allergies  Allergen Reactions  . Penicillins     REACTION: rash   No current outpatient prescriptions on file prior to visit.   No current facility-administered medications on file prior to visit.    Review of Systems    Review of Systems  Constitutional: Negative for fever, appetite change, fatigue and unexpected weight change.  Eyes: Negative for pain and visual disturbance.  Respiratory: Negative for cough and shortness of breath.  Cardiovascular: Negative for cp or palpitations    Gastrointestinal: Negative for nausea, diarrhea and constipation. pos for change in stool color without blood and also for epigastric pain at times  Genitourinary: Negative for urgency and frequency.  Skin: Negative for pallor or rash   Neurological: Negative for weakness, light-headedness, numbness and headaches.  Hematological: Negative for adenopathy. Does not bruise/bleed easily.  Psychiatric/Behavioral: Negative for dysphoric mood. The patient is not nervous/anxious.      Objective:   Physical Exam  Constitutional: She appears well-developed and well-nourished. No distress.  HENT:  Head: Normocephalic and atraumatic.  Mouth/Throat: Oropharynx is clear and moist.  Eyes: Conjunctivae and EOM are normal. Pupils are equal, round, and reactive to light.  No scleral icterus.  Neck: Normal range of motion. Neck supple. No thyromegaly present.  Cardiovascular: Normal rate and regular rhythm.   Pulmonary/Chest: Effort normal and breath sounds normal. No respiratory distress. She has no wheezes. She has no rales. She exhibits no tenderness.  Abdominal: Soft. Bowel sounds are normal. She exhibits no distension and no mass. There is no hepatosplenomegaly. There is tenderness in the right upper quadrant and epigastric area. There is no rigidity, no rebound, no guarding, no CVA tenderness, no tenderness at McBurney's point and negative Murphy's sign.  Very mild tenderness  Lymphadenopathy:    She has no cervical adenopathy.  Neurological: She is alert. She has normal reflexes. No cranial nerve deficit. She exhibits normal muscle tone. Coordination normal.  Skin: Skin is warm and dry. No rash noted. No pallor.  No jaundice   Psychiatric: She has a normal mood and affect.          Assessment & Plan:

## 2013-04-04 NOTE — Assessment & Plan Note (Signed)
?   If significant or dietary Lab today  Pt will keep journal of diet and symptoms  Reminded that more than one alcohol serving per day is too much for female-she voiced understanding No s/s/ of hyperbilirubinemia at all

## 2013-04-04 NOTE — Assessment & Plan Note (Signed)
Lab today Has been in resp to acidic beverages-poss gastritis or early gerd Trial of zantac Pend lab resulte

## 2013-04-16 ENCOUNTER — Ambulatory Visit (HOSPITAL_BASED_OUTPATIENT_CLINIC_OR_DEPARTMENT_OTHER): Payer: BC Managed Care – PPO | Attending: Pulmonary Disease

## 2013-04-16 DIAGNOSIS — R0683 Snoring: Secondary | ICD-10-CM

## 2013-04-16 DIAGNOSIS — R0609 Other forms of dyspnea: Secondary | ICD-10-CM | POA: Insufficient documentation

## 2013-04-16 DIAGNOSIS — R0989 Other specified symptoms and signs involving the circulatory and respiratory systems: Principal | ICD-10-CM | POA: Insufficient documentation

## 2013-04-22 ENCOUNTER — Telehealth: Payer: Self-pay | Admitting: Pulmonary Disease

## 2013-04-22 DIAGNOSIS — R0609 Other forms of dyspnea: Secondary | ICD-10-CM

## 2013-04-22 DIAGNOSIS — R0989 Other specified symptoms and signs involving the circulatory and respiratory systems: Secondary | ICD-10-CM

## 2013-04-22 DIAGNOSIS — G4733 Obstructive sleep apnea (adult) (pediatric): Secondary | ICD-10-CM

## 2013-04-22 NOTE — Telephone Encounter (Signed)
PSG 04/16/13 >> AHI of 23.2, SaO2 low of 87%.  Unable to tolerate CPAP.  BiPAP 9/5 >> AHI 0.  Will have my nurse inform pt that sleep study shows moderate sleep apnea.  Options are to either 1) have ROV to review study, or 2) proceed with BiPAP set up and then ROV two months after BiPAP set up.  If pt chooses to have BiPAP set up, then please arrange for BiPAP 9/5 cm H2O with heated humidity, and mask of choice.

## 2013-04-22 NOTE — Sleep Study (Signed)
[place]  NAME: Heather Mcdonald DATE OF BIRTH:  07-05-1955 MEDICAL RECORD NUMBER [id number]  LOCATION: [facility]  PHYSICIAN: Chesley Mires, M.D. DATE OF STUDY: 04/16/2013  SLEEP STUDY TYPE: Nocturnal Polysomnogram               REFERRING PHYSICIAN: Chesley Mires, MD  INDICATION FOR STUDY:  58 year old female with snoring, sleep disruption, and daytime sleepiness presents for evaluation of hypersomnia with obstructive sleep apnea.  EPWORTH SLEEPINESS SCORE: 8. HEIGHT: 5 feet 5 inches  WEIGHT: 149 lbs NECK SIZE: 15 in.  MEDICATIONS:  No current outpatient prescriptions on file prior to visit.   No current facility-administered medications on file prior to visit.    SLEEP ARCHITECTURE:  The patient followed a split night protocol.  Diagnostic portion: Total recording time: 188 minutes.  Total sleep time was: 142 minutes.  Sleep efficiency: 75.5%.  Sleep latency: 36 minutes.  REM latency: 124 minutes.  Stage N1: 9.5%.  Stage N2: 76.1%.  Stage N3: 0.7%.  Stage R:  13.7%.  Supine sleep: 11 minutes.  Non-supine sleep: 131 minutes.  Titration portion: Total recording time: 227 minutes.  Total sleep time was: 156 minutes.  Sleep efficiency: 68.9%.  Sleep latency: 1 minutes.  REM latency: 125 minutes.  Stage N1: 5.4%.  Stage N2: 78.6%.  Stage N3: 0%.  Stage R:  16%.  Supine sleep: 3 minutes.  Non-supine sleep: 153 minutes.  RESPIRATORY DATA: Average respiratory rate: 13.  Diagnostic portion: Snoring: Loud. Average AHI: 23.2.   Apnea index: 2.1.  Hypopnea index: 21.1. Obstructive apnea index: 2.1.  Central apnea index: 0.  Mixed apnea index: 0. REM AHI: 36.9.  NREM AHI: 21.1. Supine AHI: 49.1. Non-supine AHI: 21.1.  Titration portion: She was started on CPAP 4 cm H2O.  She had difficulty tolerating CPAP.  She was changed to BiPAP starting at 8/4 and increased to 9/5 cm H2O.  With BiPAP at 9/5 cm H2O her AHI was reduced to  0.  OXYGEN DATA:  Baseline oxygenation: 96%. Lowest SaO2: 87%. Time spent below SaO2 90%: 1.4 minutes. Supplemental oxygen used: None.  CARDIAC DATA:  Average heart rate: 67 beats per minute. Rhythm strip: Normal sinus rhythm with occasional PAC's.  MOVEMENT/PARASOMNIA:  Periodic limb movement: 0.  Period limb movements with arousals: 0. Restroom trips: One.  IMPRESSION/ RECOMMENDATION:   This study shows moderate obstructive sleep apnea with an AHI of 23.2 and SaO2 low of 87%.  She had difficulty tolerating CPAP.  She did well with BiPAP 9/5 cm H2O.  She was fitted with a small size Fisher Paykel simplus full face mask.    Chesley Mires, M.D. Diplomate, [employer] adviser of Sleep Medicine  ELECTRONICALLY SIGNED ON:  04/22/2013, 3:58 PM [place] PH: [phone]   FX: [phone] [place]

## 2013-04-23 NOTE — Telephone Encounter (Signed)
Pt is aware of results. She would like to proceed with BiPAP set up. Order will be placed. Recall will be placed for 2 month ROV.

## 2013-04-24 ENCOUNTER — Other Ambulatory Visit: Payer: Self-pay | Admitting: Family Medicine

## 2013-04-24 DIAGNOSIS — R921 Mammographic calcification found on diagnostic imaging of breast: Secondary | ICD-10-CM

## 2013-04-25 ENCOUNTER — Telehealth: Payer: Self-pay | Admitting: Internal Medicine

## 2013-04-25 NOTE — Telephone Encounter (Signed)
PSG faxed to [facility] to att [facility] aware  Nothing further needed

## 2013-04-25 NOTE — Telephone Encounter (Signed)
Spoke with the [facility] and they will fax the whole study over and will then call Heather Mcdonald to get his fax number  Will await the fax

## 2013-05-01 ENCOUNTER — Telehealth: Payer: Self-pay | Admitting: Pulmonary Disease

## 2013-05-01 NOTE — Telephone Encounter (Signed)
Pt scheduled appt to see VS in may. Nothing further needed

## 2013-05-01 NOTE — Telephone Encounter (Signed)
Pt is returning call & can be reached at [phone].  Pt will not be available from 4 to about 4:30 this afternoon.  Satira Anis

## 2013-05-01 NOTE — Telephone Encounter (Signed)
Pt has no appt scheduled with VS LMTCB x1 for pt

## 2013-05-21 ENCOUNTER — Ambulatory Visit (INDEPENDENT_AMBULATORY_CARE_PROVIDER_SITE_OTHER): Payer: BC Managed Care – PPO | Admitting: Pulmonary Disease

## 2013-05-21 ENCOUNTER — Encounter: Payer: Self-pay | Admitting: Pulmonary Disease

## 2013-05-21 VITALS — BP 130/90 | HR 65 | Ht 65.0 in | Wt 154.0 lb

## 2013-05-21 DIAGNOSIS — G4733 Obstructive sleep apnea (adult) (pediatric): Secondary | ICD-10-CM

## 2013-05-21 NOTE — Patient Instructions (Signed)
Will arrange for BiPAP set up Follow up in 2 months

## 2013-05-21 NOTE — Assessment & Plan Note (Signed)
She has moderate sleep apnea.  She has hx of TMJ.    I have reviewed the recent sleep study results with the patient.  We discussed how sleep apnea can affect various health problems including risks for hypertension, cardiovascular disease, and diabetes.  We also discussed how sleep disruption can increase risks for accident, such as while driving.  Weight loss as a means of improving sleep apnea was also reviewed.  Additional treatment options discussed were CPAP therapy, oral appliance, and surgical intervention.  She is willing to try BiPAP set up.

## 2013-05-21 NOTE — Progress Notes (Signed)
Chief Complaint  Patient presents with  . Sleep Apnea    History of Present Illness: Heather Mcdonald is a 58 y.o. female with OSA.  She is here to review her sleep study.  This showed moderate sleep apnea.  She had several questions before proceeding with BiPAP set up.  TESTS: PSG 04/16/13 >> AHI of 23.2, SaO2 low of 87%.  Unable to tolerate CPAP.  BiPAP 9/5 >> AHI 0.  Heather Mcdonald  has a past medical history of Allergic rhinitis; Papanicolaou smear of cervix with atypical squamous cells of undetermined significance (ASC-US); Fibrocystic breast disease; Anxiety disorder; History of uterine fibroid; RUQ pain; Bronchitis (24+yrs ago); Joint pain; Gestational diabetes; Gallbladder dyskinesia (01/28/2011); Foot fracture; and Temporomandibular joint disorder (TMJ).  Heather Mcdonald  has past surgical history that includes Lipoma excision; cervical cryo (1996); basal cell removal; Colonoscopy (04/08/2008); Breast cyst aspiration (9/11 and 2/12); and Cholecystectomy (03/02/2011).  Prior to Admission medications   Not on File    Allergies  Allergen Reactions  . Penicillins     REACTION: rash     Physical Exam:  General - No distress ENT - No sinus tenderness, no oral exudate, no LAN, MP 4, scalloped tongue, TMJ click, elongated uvula Cardiac - s1s2 regular, no murmur Chest - No wheeze/rales/dullness Back - No focal tenderness Abd - Soft, non-tender Ext - No edema Neuro - Normal strength Skin - No rashes Psych - normal mood, and behavior   Assessment/Plan:  Heather Mires, MD [place] [facility]/Critical Care/Sleep Pager:  [phone]

## 2013-05-24 ENCOUNTER — Other Ambulatory Visit: Payer: Self-pay | Admitting: Family Medicine

## 2013-05-25 ENCOUNTER — Other Ambulatory Visit: Payer: Self-pay | Admitting: Family

## 2013-05-25 ENCOUNTER — Ambulatory Visit
Admission: RE | Admit: 2013-05-25 | Discharge: 2013-05-25 | Disposition: A | Discharge status: Home / Self Care | Payer: BC Managed Care – PPO | Source: Ambulatory Visit | Attending: Family Medicine | Admitting: Family Medicine

## 2013-05-25 DIAGNOSIS — N951 Menopausal and female climacteric states: Secondary | ICD-10-CM

## 2013-05-25 DIAGNOSIS — R921 Mammographic calcification found on diagnostic imaging of breast: Secondary | ICD-10-CM

## 2013-06-01 ENCOUNTER — Ambulatory Visit
Admission: RE | Admit: 2013-06-01 | Discharge: 2013-06-01 | Disposition: A | Discharge status: Home / Self Care | Payer: BC Managed Care – PPO | Source: Ambulatory Visit | Attending: Family | Admitting: Family

## 2013-06-01 DIAGNOSIS — N951 Menopausal and female climacteric states: Secondary | ICD-10-CM

## 2013-07-03 ENCOUNTER — Telehealth: Payer: Self-pay | Admitting: Pulmonary Disease

## 2013-07-03 NOTE — Telephone Encounter (Signed)
lmtcb X1 

## 2013-07-03 NOTE — Telephone Encounter (Signed)
BiPAP 05/29/13 to 06/27/13 >> used on 27 of 30 nights with average 7 hrs 4 min.  Average AHI 3.4 with BiPAP 9/5 cm H2O.  Will have my nurse inform pt that BiPAP report looks good.  No change to current set up.  She will need ROV August 2015 to review status of therapy for sleep apnea.

## 2013-07-06 NOTE — Telephone Encounter (Signed)
LM x 2

## 2013-07-09 NOTE — Telephone Encounter (Signed)
[phone] please call the patients cell

## 2013-07-09 NOTE — Telephone Encounter (Signed)
Results have been explained to patient, pt expressed understanding.  Pt refused to scheduled at this time--on vacation-not around work calendar. Will contact our office back another time this week to schedule. Nothing further needed.

## 2013-07-11 ENCOUNTER — Telehealth: Payer: Self-pay | Admitting: Pulmonary Disease

## 2013-07-11 NOTE — Telephone Encounter (Signed)
Pt returning call.Heather Mcdonald ° °

## 2013-07-11 NOTE — Telephone Encounter (Signed)
Spoke with the pt She states that ins requires her to see VS at least before 08/26/13  However, he has nothing open until 08/27/13  Please advise if okay to overbook, thanks

## 2013-07-11 NOTE — Telephone Encounter (Signed)
LMOM x 1 

## 2013-07-12 NOTE — Telephone Encounter (Signed)
Patient returned call

## 2013-07-12 NOTE — Telephone Encounter (Signed)
LMOM x 1 

## 2013-07-12 NOTE — Telephone Encounter (Signed)
Okay to double book with me, or schedule with Tammy Parrett and then have later follow up with me.

## 2013-07-12 NOTE — Telephone Encounter (Signed)
Called spoke with patient and scheduled appt with VS on 7.10.15 @ 1015 - double-booked as okayed by VS. Pt okay with this date and time and verbalized her understanding.  Nothing further needed; will sign off.

## 2013-07-27 ENCOUNTER — Encounter: Payer: Self-pay | Admitting: Pulmonary Disease

## 2013-07-27 ENCOUNTER — Ambulatory Visit (INDEPENDENT_AMBULATORY_CARE_PROVIDER_SITE_OTHER): Payer: BC Managed Care – PPO | Admitting: Pulmonary Disease

## 2013-07-27 VITALS — BP 124/86 | HR 64 | Temp 98.0°F | Ht 65.0 in | Wt 156.0 lb

## 2013-07-27 DIAGNOSIS — F458 Other somatoform disorders: Secondary | ICD-10-CM | POA: Insufficient documentation

## 2013-07-27 DIAGNOSIS — G4733 Obstructive sleep apnea (adult) (pediatric): Secondary | ICD-10-CM

## 2013-07-27 NOTE — Assessment & Plan Note (Signed)
She is to call if not feeling better in few weeks.  Would then likely need to assess with esophagram.

## 2013-07-27 NOTE — Assessment & Plan Note (Signed)
She is compliant with BiPAP and reports benefit.  Her main issue is with aerophagia.  Will try decrease pressure setting to 8/5 cm H2O and continue prn simethicone.

## 2013-07-27 NOTE — Progress Notes (Signed)
Chief Complaint  Patient presents with  . Follow-up    Pt c/o pressure and bloating in chest/stomach since suing BiPAP. Wears nightly.     History of Present Illness: Heather Mcdonald is a 58 y.o. female with OSA.  She has been sleeping better with BiPAP.  She feels more energy.  She is getting about 7 hrs sleep per night.  She has noticed trouble with feeling bloated since using BiPAP.  This can happen latter in the day.  She feels like something is stuck in her mid chest/back area.  She uses simethicone and this helps some.  She denies dysphagia or odynophagia.  She occasional gets reflux.  TESTS: PSG 04/16/13 >> AHI of 23.2, SaO2 low of 87%.  Unable to tolerate CPAP.  BiPAP 9/5 >> AHI 0. BiPAP 05/29/13 to 06/27/13 >> used on 27 of 30 nights with average 7 hrs 4 min. Average AHI 3.4 with BiPAP 9/5 cm H2O.  Heather Mcdonald  has a past medical history of Allergic rhinitis; Papanicolaou smear of cervix with atypical squamous cells of undetermined significance (ASC-US); Fibrocystic breast disease; Anxiety disorder; History of uterine fibroid; RUQ pain; Bronchitis (24+yrs ago); Joint pain; Gestational diabetes; Gallbladder dyskinesia (01/28/2011); Foot fracture; Temporomandibular joint disorder (TMJ); and OSA (obstructive sleep apnea) (02/19/2013).  Heather Mcdonald  has past surgical history that includes Lipoma excision; cervical cryo (1996); basal cell removal; Colonoscopy (04/08/2008); Breast cyst aspiration (9/11 and 2/12); and Cholecystectomy (03/02/2011).  Prior to Admission medications   Not on File    Allergies  Allergen Reactions  . Penicillins     REACTION: rash     Physical Exam:  General - No distress ENT - No sinus tenderness, no oral exudate, no LAN, MP 4, scalloped tongue, elongated uvula Cardiac - s1s2 regular, no murmur Chest - No wheeze/rales/dullness Back - No focal tenderness Abd - Soft, non-tender Ext - No edema Neuro - Normal strength Skin - No rashes Psych -  normal mood, and behavior   Assessment/Plan:  Heather Mires, MD [place] [facility]/Critical Care/Sleep Pager:  [phone]

## 2013-07-27 NOTE — Patient Instructions (Signed)
Will decrease BiPAP to 8/5 cm H2O Continue simethicone as needed for gas Call if not feeling better in 2 weeks Follow up in 6 months

## 2013-10-18 ENCOUNTER — Other Ambulatory Visit: Payer: Self-pay | Admitting: Family Medicine

## 2013-10-18 DIAGNOSIS — R921 Mammographic calcification found on diagnostic imaging of breast: Secondary | ICD-10-CM

## 2013-11-02 ENCOUNTER — Other Ambulatory Visit: Payer: Self-pay

## 2013-11-12 ENCOUNTER — Ambulatory Visit
Admission: RE | Admit: 2013-11-12 | Discharge: 2013-11-12 | Disposition: A | Discharge status: Home / Self Care | Payer: BC Managed Care – PPO | Source: Ambulatory Visit | Attending: Family Medicine | Admitting: Family Medicine

## 2013-11-12 DIAGNOSIS — R921 Mammographic calcification found on diagnostic imaging of breast: Secondary | ICD-10-CM

## 2014-02-11 ENCOUNTER — Ambulatory Visit (INDEPENDENT_AMBULATORY_CARE_PROVIDER_SITE_OTHER): Payer: BLUE CROSS/BLUE SHIELD | Admitting: Pulmonary Disease

## 2014-02-11 ENCOUNTER — Encounter: Payer: Self-pay | Admitting: Pulmonary Disease

## 2014-02-11 VITALS — BP 126/82 | HR 72

## 2014-02-11 DIAGNOSIS — R0989 Other specified symptoms and signs involving the circulatory and respiratory systems: Secondary | ICD-10-CM

## 2014-02-11 DIAGNOSIS — G4733 Obstructive sleep apnea (adult) (pediatric): Secondary | ICD-10-CM

## 2014-02-11 DIAGNOSIS — R198 Other specified symptoms and signs involving the digestive system and abdomen: Secondary | ICD-10-CM

## 2014-02-11 DIAGNOSIS — F458 Other somatoform disorders: Secondary | ICD-10-CM

## 2014-02-11 DIAGNOSIS — K219 Gastro-esophageal reflux disease without esophagitis: Secondary | ICD-10-CM

## 2014-02-11 NOTE — Patient Instructions (Signed)
Try using over the counter omeprazole 20 mg daily for next few weeks >> take 30 minutes before first meal of the day Follow up in 1 year

## 2014-02-11 NOTE — Progress Notes (Signed)
Chief Complaint  Patient presents with  . Follow-up    Wears BiPAP. Denies problems with mask/pressure. Pt reports having a constant dryness in back of throat. Pt states that she turned the humidity down and that seemed to help. Pt reports waking with sore throat.     History of Present Illness: Heather Mcdonald is a 59 y.o. female with OSA.  Her issues with bloating improved after decreasing pressure setting of BiPAP.  She is not having issues with mask fit.  Since she decreased her humidifier setting, it is easier to tolerate BiPAP.  She has been getting feeling like something is stuck in her throat.  She is not able to cough anything up.  She has noticed problems with heartburn >> she tried OTC tagament and this helped.  She denies sinus congestion or post nasal drip.  TESTS: PSG 04/16/13 >> AHI of 23.2, SaO2 low of 87%.  Unable to tolerate CPAP.  BiPAP 9/5 >> AHI 0. BiPAP 05/29/13 to 06/27/13 >> used on 27 of 30 nights with average 7 hrs 4 min. Average AHI 3.4 with BiPAP 9/5 cm H2O.  PMHx >> Allergic rhinitis, Anxiety, TMJ  PSHx, Medications, Allergies, Fhx, Shx reviewed.  Physical Exam: Blood pressure 126/82, pulse 72, last menstrual period 01/19/2010, SpO2 97 %.  General - No distress ENT - No sinus tenderness, no oral exudate, no LAN, MP 4, scalloped tongue, elongated uvula Cardiac - s1s2 regular, no murmur Chest - No wheeze/rales/dullness Back - No focal tenderness Abd - Soft, non-tender Ext - No edema Neuro - Normal strength Skin - No rashes Psych - normal mood, and behavior   Assessment/Plan:  Obstructive sleep apnea. She is compliant with therapy and reports benefit from BiPAP. Plan: - continue BiPAP 8/5 cm H2O  Globus sensation. Could be related to acid reflux. Plan: - advised her to try OTC omeprazole 20 mg daily for few weeks - if persists, then she should f/u with her PCP to further assess   Heather Mires, MD [place] Pager:   [phone]

## 2014-10-14 ENCOUNTER — Other Ambulatory Visit: Payer: Self-pay

## 2014-10-14 DIAGNOSIS — Z1231 Encounter for screening mammogram for malignant neoplasm of breast: Secondary | ICD-10-CM

## 2014-11-15 ENCOUNTER — Ambulatory Visit
Admission: RE | Admit: 2014-11-15 | Discharge: 2014-11-15 | Disposition: A | Discharge status: Home / Self Care | Payer: BLUE CROSS/BLUE SHIELD | Source: Ambulatory Visit

## 2014-11-15 DIAGNOSIS — Z1231 Encounter for screening mammogram for malignant neoplasm of breast: Secondary | ICD-10-CM

## 2014-12-17 ENCOUNTER — Encounter: Payer: Self-pay | Admitting: Family Medicine

## 2014-12-17 ENCOUNTER — Ambulatory Visit (INDEPENDENT_AMBULATORY_CARE_PROVIDER_SITE_OTHER): Payer: BLUE CROSS/BLUE SHIELD | Admitting: Family Medicine

## 2014-12-17 VITALS — BP 130/70 | HR 79 | Temp 98.3°F | Ht 65.0 in | Wt 154.8 lb

## 2014-12-17 DIAGNOSIS — Z0000 Encounter for general adult medical examination without abnormal findings: Secondary | ICD-10-CM | POA: Diagnosis not present

## 2014-12-17 NOTE — Patient Instructions (Signed)
Take care of yourself  Get a copy of your labs to me when you can   Stay active Use your sunscreen

## 2014-12-17 NOTE — Progress Notes (Signed)
Pre visit review using our clinic review tool, if applicable. No additional management support is needed unless otherwise documented below in the visit note. 

## 2014-12-17 NOTE — Progress Notes (Signed)
Subjective:    Patient ID: Heather Mcdonald, female    DOB: Feb 03, 1955, 59 y.o.   MRN: [id number]  HPI Here for health maintenance exam and to review chronic medical problems    Wt is down 2 lb with bmi of 25  bp is higher than usual BP Readings from Last 3 Encounters:  12/17/14 142/72  02/11/14 126/82  07/27/13 124/86   re check imp BP: 130/70 mmHg   bp was improving with bipap machine   Father has hx of elevated bp   Wt is down 2 lb with bmi of25   Hep C/ HIV screening  - declines/ not high risk   Flu shot -had it at work   Pap 2014 nl  Ascus 2011 - neg HPV  No symptoms or problems  No new partners Does have vaginal dryness   Mm 10 /16 nl  Self exam-no lumps   Colonoscopy 3/10 - 10 year recall  No problems   Td 1/14   Had labs drawn at work- will forward them to [place] when she gets them back  Does not need labs drawn here   Patient Active Problem List   Diagnosis Date Noted  . Aerophagia 07/27/2013  . Clay-colored stools 04/03/2013  . Abdominal pain, epigastric 04/03/2013  . OSA (obstructive sleep apnea) 02/19/2013  . Fatigue 02/19/2013  . Right-sided thoracic back pain 02/19/2013  . Routine general medical examination at a health care facility 02/18/2012  . Routine gynecological examination 02/18/2012  . ALLERGIC RHINITIS 12/22/2006  . FIBROCYSTIC BREAST DISEASE 12/22/2006   Past Medical History  Diagnosis Date  . Allergic rhinitis   . Papanicolaou smear of cervix with atypical squamous cells of undetermined significance (ASC-US)   . Fibrocystic breast disease   . Anxiety disorder   . History of uterine fibroid   . RUQ pain   . Bronchitis 24+yrs ago  . Joint pain   . Gestational diabetes   . Gallbladder dyskinesia 01/28/2011  . Foot fracture   . Temporomandibular joint disorder (TMJ)   . OSA (obstructive sleep apnea) 02/19/2013   Past Surgical History  Procedure Laterality Date  . Lipoma excision      from back  . Cervical cryo  1996  .  Basal cell removal      from face  . Colonoscopy  04/08/2008    normal  . Breast cyst aspiration  9/11 and 2/12    benign  . Cholecystectomy  03/02/2011    Procedure: LAPAROSCOPIC CHOLECYSTECTOMY;  Surgeon: Haywood Lasso, MD;  Location: [place];  Service: General;  Laterality: N/A;  attempted intraoperative cholangiogram   Social History  Substance Use Topics  . Smoking status: Never Smoker   . Smokeless tobacco: Never Used  . Alcohol Use: 0.0 oz/week    0 Standard drinks or equivalent per week     Comment: socially-couple of times a week   Family History  Problem Relation Age of Onset  . Diabetes type II Father   . Hypertension Father   . Cancer Father     Bladder  . Hypertension Mother   . Colon polyps Mother   . Anesthesia problems Mother   . Breast cancer Maternal Grandmother     ? female cancer  . Cancer Maternal Grandmother     breast  . Hypotension Neg Hx   . Malignant hyperthermia Neg Hx   . Pseudochol deficiency Neg Hx    Allergies  Allergen Reactions  . Penicillins  REACTION: rash   Current Outpatient Prescriptions on File Prior to Visit  Medication Sig Dispense Refill  . Multiple Vitamin (MULTIVITAMIN) capsule Take 1 capsule by mouth daily.     No current facility-administered medications on file prior to visit.     Review of Systems Review of Systems  Constitutional: Negative for fever, appetite change, fatigue and unexpected weight change.  Eyes: Negative for pain and visual disturbance.  Respiratory: Negative for cough and shortness of breath.   Cardiovascular: Negative for cp or palpitations    Gastrointestinal: Negative for nausea, diarrhea and constipation.  Genitourinary: Negative for urgency and frequency.  Skin: Negative for pallor or rash   Neurological: Negative for weakness, light-headedness, numbness and headaches.  Hematological: Negative for adenopathy. Does not bruise/bleed easily.  Psychiatric/Behavioral: Negative for dysphoric  mood. The patient is not nervous/anxious.         Objective:   Physical Exam  Constitutional: She appears well-developed and well-nourished. No distress.  Well appearing   HENT:  Head: Normocephalic and atraumatic.  Right Ear: External ear normal.  Left Ear: External ear normal.  Mouth/Throat: Oropharynx is clear and moist.  Eyes: Conjunctivae and EOM are normal. Pupils are equal, round, and reactive to light. No scleral icterus.  Neck: Normal range of motion. Neck supple. No JVD present. Carotid bruit is not present. No thyromegaly present.  Cardiovascular: Normal rate, regular rhythm, normal heart sounds and intact distal pulses.  Exam reveals no gallop.   Pulmonary/Chest: Effort normal and breath sounds normal. No respiratory distress. She has no wheezes. She exhibits no tenderness.  Abdominal: Soft. Bowel sounds are normal. She exhibits no distension, no abdominal bruit and no mass. There is no tenderness.  Genitourinary: No breast swelling, tenderness, discharge or bleeding.  Breast exam: No mass, nodules, thickening, tenderness, bulging, retraction, inflamation, nipple discharge or skin changes noted.  No axillary or clavicular LA.      Musculoskeletal: Normal range of motion. She exhibits no edema or tenderness.  Lymphadenopathy:    She has no cervical adenopathy.  Neurological: She is alert. She has normal reflexes. No cranial nerve deficit. She exhibits normal muscle tone. Coordination normal.  Skin: Skin is warm and dry. No rash noted. No erythema. No pallor.  Psychiatric: She has a normal mood and affect.          Assessment & Plan:   Problem List Items Addressed This Visit      Other   Routine general medical examination at a health care facility - Primary    Reviewed health habits including diet and exercise and skin cancer prevention Reviewed appropriate screening tests for age  Also reviewed health mt list, fam hx and immunization status , as well as social and  family history   See HPI Labs drawn at work-pending review of these  Re check bp improved  BP: 130/70 mmHg  Take care of yourself  Get a copy of your labs to me when you can   Stay active Use your sunscreen

## 2014-12-19 NOTE — Assessment & Plan Note (Signed)
Reviewed health habits including diet and exercise and skin cancer prevention Reviewed appropriate screening tests for age  Also reviewed health mt list, fam hx and immunization status , as well as social and family history   See HPI Labs drawn at work-pending review of these  Re check bp improved  BP: 130/70 mmHg  Take care of yourself  Get a copy of your labs to me when you can   Stay active Use your sunscreen

## 2015-02-20 ENCOUNTER — Ambulatory Visit (INDEPENDENT_AMBULATORY_CARE_PROVIDER_SITE_OTHER): Payer: BLUE CROSS/BLUE SHIELD | Admitting: Pulmonary Disease

## 2015-02-20 ENCOUNTER — Encounter: Payer: Self-pay | Admitting: Pulmonary Disease

## 2015-02-20 VITALS — BP 132/76 | HR 75 | Ht 65.0 in | Wt 157.2 lb

## 2015-02-20 DIAGNOSIS — G4733 Obstructive sleep apnea (adult) (pediatric): Secondary | ICD-10-CM | POA: Diagnosis not present

## 2015-02-20 NOTE — Progress Notes (Addendum)
Current Outpatient Prescriptions on File Prior to Visit  Medication Sig  . Multiple Vitamin (MULTIVITAMIN) capsule Take 1 capsule by mouth daily.   No current facility-administered medications on file prior to visit.     Chief Complaint  Patient presents with  . Follow-up    1 yr OSA follow up - wearing CPAP everyday, 7hrs on average.  denies any pressure, mask issues.  no supplies needed      Tests PSG 04/16/13 >> AHI of 23.2, SaO2 low of 87%. Unable to tolerate CPAP. BiPAP 9/5 >> AHI 0. BiPAP 01/20/15 to 02/18/15 >> used on 30 of 30 nights with average 7 hrs 14 min. Average AHI 3.4 with BiPAP 8/5 cm H2O.  Past medical hx Allergic rhinitis, Anxiety, TMJ  Past surgical hx, Allergies, Family hx, Social hx all reviewed.  Vital Signs BP 132/76 mmHg  Pulse 75  Ht 5\' 5"  (1.651 m)  Wt 157 lb 3.2 oz (71.305 kg)  BMI 26.16 kg/m2  SpO2 97%  LMP 01/19/2010  History of Present Illness Heather Mcdonald is a 60 y.o. female with OSA.  She has been doing well recently with BiPAP.  She had trouble few months ago, but this got better after she got new supplies.  She gets about 7 hrs sleep per night.  She did get better after trial of PPI last year.  She is not having globus sensation recently.  Physical Exam  General - No distress ENT - No sinus tenderness, no oral exudate, no LAN Cardiac - s1s2 regular, no murmur Chest - No wheeze/rales/dullness Back - No focal tenderness Abd - Soft, non-tender Ext - No edema Neuro - Normal strength Skin - No rashes Psych - normal mood, and behavior   Assessment/Plan  Obstructive sleep apnea. She is compliant with therapy and reports benefit from BiPAP. Plan: - continue BiPAP 8/5 cm H2O   Patient Instructions  Follow up in 1 year     Heather Mires, MD [place] [facility] Pager:  [phone]

## 2015-02-20 NOTE — Patient Instructions (Signed)
Follow up in 1 year.

## 2015-08-01 DIAGNOSIS — G4733 Obstructive sleep apnea (adult) (pediatric): Secondary | ICD-10-CM | POA: Diagnosis not present

## 2015-10-20 ENCOUNTER — Other Ambulatory Visit: Payer: Self-pay | Admitting: Family Medicine

## 2015-10-20 DIAGNOSIS — Z1231 Encounter for screening mammogram for malignant neoplasm of breast: Secondary | ICD-10-CM

## 2015-10-27 DIAGNOSIS — Z85828 Personal history of other malignant neoplasm of skin: Secondary | ICD-10-CM | POA: Diagnosis not present

## 2015-10-27 DIAGNOSIS — L281 Prurigo nodularis: Secondary | ICD-10-CM | POA: Diagnosis not present

## 2015-11-12 DIAGNOSIS — G4733 Obstructive sleep apnea (adult) (pediatric): Secondary | ICD-10-CM | POA: Diagnosis not present

## 2015-11-17 ENCOUNTER — Ambulatory Visit
Admission: RE | Admit: 2015-11-17 | Discharge: 2015-11-17 | Disposition: A | Discharge status: Home / Self Care | Payer: BLUE CROSS/BLUE SHIELD | Source: Ambulatory Visit | Attending: Family Medicine | Admitting: Family Medicine

## 2015-11-17 DIAGNOSIS — Z1231 Encounter for screening mammogram for malignant neoplasm of breast: Secondary | ICD-10-CM

## 2015-11-19 ENCOUNTER — Other Ambulatory Visit: Payer: Self-pay | Admitting: Family Medicine

## 2015-11-19 DIAGNOSIS — R928 Other abnormal and inconclusive findings on diagnostic imaging of breast: Secondary | ICD-10-CM

## 2015-11-26 ENCOUNTER — Ambulatory Visit
Admission: RE | Admit: 2015-11-26 | Discharge: 2015-11-26 | Disposition: A | Discharge status: Home / Self Care | Payer: BLUE CROSS/BLUE SHIELD | Source: Ambulatory Visit | Attending: Family Medicine | Admitting: Family Medicine

## 2015-11-26 DIAGNOSIS — R928 Other abnormal and inconclusive findings on diagnostic imaging of breast: Secondary | ICD-10-CM

## 2015-11-26 DIAGNOSIS — N6320 Unspecified lump in the left breast, unspecified quadrant: Secondary | ICD-10-CM | POA: Diagnosis not present

## 2015-11-26 DIAGNOSIS — N6310 Unspecified lump in the right breast, unspecified quadrant: Secondary | ICD-10-CM | POA: Diagnosis not present

## 2015-12-19 ENCOUNTER — Encounter: Payer: BLUE CROSS/BLUE SHIELD | Admitting: Family Medicine

## 2016-02-19 DIAGNOSIS — G4733 Obstructive sleep apnea (adult) (pediatric): Secondary | ICD-10-CM | POA: Diagnosis not present

## 2016-02-20 ENCOUNTER — Encounter: Payer: Self-pay | Admitting: Adult Health

## 2016-02-20 ENCOUNTER — Ambulatory Visit (INDEPENDENT_AMBULATORY_CARE_PROVIDER_SITE_OTHER): Payer: BLUE CROSS/BLUE SHIELD | Admitting: Adult Health

## 2016-02-20 ENCOUNTER — Ambulatory Visit: Payer: BLUE CROSS/BLUE SHIELD | Admitting: Pulmonary Disease

## 2016-02-20 DIAGNOSIS — G4733 Obstructive sleep apnea (adult) (pediatric): Secondary | ICD-10-CM | POA: Diagnosis not present

## 2016-02-20 NOTE — Progress Notes (Signed)
I have reviewed and agree with assessment/plan.  Chesley Mires, MD [facility] Pulmonary/Critical Care 02/20/2016, 2:26 PM Pager:  [phone]

## 2016-02-20 NOTE — Patient Instructions (Signed)
Continue on BIPAP At bedtime  .  Keep up good work .  Work on weight loss.  Do not drive if sleepy.  follow up Dr. Halford Chessman  In 1 year and As needed

## 2016-02-20 NOTE — Assessment & Plan Note (Signed)
Moderate OSA , well controlled on BIPAP   Plan  Patient Instructions  Continue on BIPAP At bedtime  .  Keep up good work .  Work on weight loss.  Do not drive if sleepy.  follow up Dr. Halford Chessman  In 1 year and As needed

## 2016-02-20 NOTE — Progress Notes (Signed)
@Patient  ID: Heather Mcdonald, female    DOB: Sep 27, 1955, 61 y.o.   MRN: [id number]  Chief Complaint  Patient presents with  . Follow-up    OSA     Referring provider: Abner Greenspan, MD  HPI: 61 yo female followed for moderate OSA on BIPAP   TEST  PSG 04/16/13 >> AHI of 23.2, SaO2 low of 87%. Unable to tolerate CPAP. BiPAP 9/5 >> AHI 0. BiPAP 01/20/15 to 02/18/15 >> used on 30 of 30 nights with average 7 hrs 14 min. Average AHI 3.4 with BiPAP 8/5 cm H2O.  02/20/2016 Follow up : OSA  Patient presents for a one-year follow-up for moderate sleep apnea. She says she is doing well on her BiPAP at bedtime. She feels rested with no significant daytime sleepiness. She is wearing her machine every single night for 7 hrs.  Download shows excellent compliance with average. Uses at 7 hours. She is on BiPAP with an IPAP and 8 and EPAP at 5 cm of H2O . AHI 2.9   She has been under a lot of stress , father died in tragic ATV collision, husband lost job and she had a job change. Support provided.   Allergies  Allergen Reactions  . Penicillins     REACTION: rash    Immunization History  Administered Date(s) Administered  . H1N1 02/21/2008  . Influenza Split 11/13/2012, 09/19/2015  . Influenza Whole 10/28/2006  . Influenza,inj,Quad PF,36+ Mos 10/18/2013  . Influenza-Unspecified 10/03/2014  . Td 01/18/2002  . Tdap 02/18/2012    Past Medical History:  Diagnosis Date  . Allergic rhinitis   . Anxiety disorder   . Bronchitis 24+yrs ago  . Fibrocystic breast disease   . Foot fracture   . Gallbladder dyskinesia 01/28/2011  . Gestational diabetes   . History of uterine fibroid   . Joint pain   . OSA (obstructive sleep apnea) 02/19/2013  . Papanicolaou smear of cervix with atypical squamous cells of undetermined significance (ASC-US)   . RUQ pain   . Temporomandibular joint disorder (TMJ)     Tobacco History: History  Smoking Status  . Never Smoker  Smokeless Tobacco  . Never Used     Counseling given: Not Answered   Outpatient Encounter Prescriptions as of 02/20/2016  Medication Sig  . Multiple Vitamin (MULTIVITAMIN) capsule Take 1 capsule by mouth daily.   No facility-administered encounter medications on file as of 02/20/2016.      Review of Systems  Constitutional:   No  weight loss, night sweats,  Fevers, chills, + fatigue, or  lassitude.  HEENT:   No headaches,  Difficulty swallowing,  Tooth/dental problems, or  Sore throat,                No sneezing, itching, ear ache, nasal congestion, post nasal drip,   CV:  No chest pain,  Orthopnea, PND, swelling in lower extremities, anasarca, dizziness, palpitations, syncope.   GI  No heartburn, indigestion, abdominal pain, nausea, vomiting, diarrhea, change in bowel habits, loss of appetite, bloody stools.   Resp: No shortness of breath with exertion or at rest.  No excess mucus, no productive cough,  No non-productive cough,  No coughing up of blood.  No change in color of mucus.  No wheezing.  No chest wall deformity  Skin: no rash or lesions.  GU: no dysuria, change in color of urine, no urgency or frequency.  No flank pain, no hematuria   MS:  No joint pain or swelling.  No decreased range of motion.  No back pain.    Physical Exam  Pulse 66   Ht 5\' 5"  (1.651 m)   Wt 156 lb 9.6 oz (71 kg)   LMP 02/01/2010   SpO2 98%   BMI 26.06 kg/m   GEN: A/Ox3; pleasant , NAD, well nourished    HEENT:  [place]/AT,  EACs-clear, TMs-wnl, NOSE-clear, THROAT-clear, no lesions, no postnasal drip or exudate noted. Class 2 MP airway   NECK:  Supple w/ fair ROM; no JVD; normal carotid impulses w/o bruits; no thyromegaly or nodules palpated; no lymphadenopathy.    RESP  Clear  P & A; w/o, wheezes/ rales/ or rhonchi. no accessory muscle use, no dullness to percussion  CARD:  RRR, no m/r/g, no peripheral edema, pulses intact, no cyanosis or clubbing.  GI:   Soft & nt; nml bowel sounds; no organomegaly or masses detected.    Musco: Warm bil, no deformities or joint swelling noted.   Neuro: alert, no focal deficits noted.    Skin: Warm, no lesions or rashes  BNP No results found for: BNP  ProBNP No results found for: PROBNP  Imaging: No results found.   Assessment & Plan:   OSA (obstructive sleep apnea) Moderate OSA , well controlled on BIPAP   Plan  Patient Instructions  Continue on BIPAP At bedtime  .  Keep up good work .  Work on weight loss.  Do not drive if sleepy.  follow up Dr. Halford Chessman  In 1 year and As needed         Rexene Edison, NP 02/20/2016

## 2016-03-01 ENCOUNTER — Other Ambulatory Visit (HOSPITAL_COMMUNITY)
Admission: RE | Admit: 2016-03-01 | Discharge: 2016-03-01 | Disposition: A | Discharge status: Home / Self Care | Payer: BLUE CROSS/BLUE SHIELD | Source: Ambulatory Visit | Attending: Family Medicine | Admitting: Family Medicine

## 2016-03-01 ENCOUNTER — Ambulatory Visit (INDEPENDENT_AMBULATORY_CARE_PROVIDER_SITE_OTHER): Payer: BLUE CROSS/BLUE SHIELD | Admitting: Family Medicine

## 2016-03-01 ENCOUNTER — Encounter: Payer: Self-pay | Admitting: Family Medicine

## 2016-03-01 VITALS — BP 126/72 | HR 66 | Temp 98.3°F | Ht 65.0 in | Wt 156.5 lb

## 2016-03-01 DIAGNOSIS — Z1151 Encounter for screening for human papillomavirus (HPV): Secondary | ICD-10-CM | POA: Diagnosis not present

## 2016-03-01 DIAGNOSIS — Z01419 Encounter for gynecological examination (general) (routine) without abnormal findings: Secondary | ICD-10-CM | POA: Diagnosis not present

## 2016-03-01 DIAGNOSIS — Z01411 Encounter for gynecological examination (general) (routine) with abnormal findings: Secondary | ICD-10-CM | POA: Insufficient documentation

## 2016-03-01 DIAGNOSIS — Z800 Family history of malignant neoplasm of digestive organs: Secondary | ICD-10-CM | POA: Diagnosis not present

## 2016-03-01 DIAGNOSIS — M810 Age-related osteoporosis without current pathological fracture: Secondary | ICD-10-CM | POA: Diagnosis not present

## 2016-03-01 DIAGNOSIS — Z0000 Encounter for general adult medical examination without abnormal findings: Secondary | ICD-10-CM

## 2016-03-01 DIAGNOSIS — E2839 Other primary ovarian failure: Secondary | ICD-10-CM | POA: Diagnosis not present

## 2016-03-01 NOTE — Assessment & Plan Note (Signed)
Refer for dexa  No falls or fx Disc imp of ca and d

## 2016-03-01 NOTE — Progress Notes (Signed)
Subjective:    Patient ID: Heather Mcdonald, female    DOB: December 19, 1955, 61 y.o.   MRN: [id number]  HPI Here for health maintenance exam and to review chronic medical problems    Feeling ok overall   Wt Readings from Last 3 Encounters:  03/01/16 156 lb 8 oz (71 kg)  02/20/16 156 lb 9.6 oz (71 kg)  02/20/15 157 lb 3.2 oz (71.3 kg)  bmi of 26.0 Exercising regularly - it helps bp and stress  Has had a hard year   Father was killed in an accident (ATV) Nephew died MI  Co worker died of pancreatic cancer   Hep C/ HIV screening -declines due to low risk   Mammogram 11/17- benign cysts with 1 y f/u Self breast exam - no new lumps or changes   Pap/gyn care 1/14 nl pap- due for one  No gyn problems or symptoms    Zoster vaccine -will consider at 62 if it is covered  Flu vaccine 9/17 Tetanus vaccine 1/14  Colonoscopy/ screening  New family hx - mother was diag with colon cancer in late life  Colonoscopy 3/10 nl with 10 year recall  Has to check on coverage for screening   dexa 5/15 osteoporosis at work Low bone mass  She is not taking ca and D  Wants a dexa referral - it can be organized through work No fractures No falls    Hx of moderate OSA Controlled with bipap  Seen yearly by pulmonary   BP Readings from Last 3 Encounters:  03/01/16 126/72  02/20/15 132/76  12/17/14 130/70   She has been watching bp at home  120s- 130s/ 51s  Father had HTN= thin/ smoker  Mother HTN- overwt  Brought some labs  Nl cbc Nl chem with cr of 1.02  Nl lft Glucose 95  Chol 175 Trig 69 HDL is 63 LDL 98   A1C 5.4   Good labs   Patient Active Problem List   Diagnosis Date Noted  . Family history of colon cancer 03/01/2016  . Estrogen deficiency 03/01/2016  . Osteoporosis 03/01/2016  . Aerophagia 07/27/2013  . OSA (obstructive sleep apnea) 02/19/2013  . Routine general medical examination at a health care facility 02/18/2012  . Encounter for routine gynecological  examination 02/18/2012  . ALLERGIC RHINITIS 12/22/2006  . FIBROCYSTIC BREAST DISEASE 12/22/2006   Past Medical History:  Diagnosis Date  . Allergic rhinitis   . Anxiety disorder   . Bronchitis 24+yrs ago  . Fibrocystic breast disease   . Foot fracture   . Gallbladder dyskinesia 01/28/2011  . Gestational diabetes   . History of uterine fibroid   . Joint pain   . OSA (obstructive sleep apnea) 02/19/2013  . Papanicolaou smear of cervix with atypical squamous cells of undetermined significance (ASC-US)   . RUQ pain   . Temporomandibular joint disorder (TMJ)    Past Surgical History:  Procedure Laterality Date  . basal cell removal     from face  . BREAST CYST ASPIRATION  9/11 and 2/12   benign  . cervical cryo  1996  . CHOLECYSTECTOMY  03/02/2011   Procedure: LAPAROSCOPIC CHOLECYSTECTOMY;  Surgeon: Haywood Lasso, MD;  Location: [place];  Service: General;  Laterality: N/A;  attempted intraoperative cholangiogram  . COLONOSCOPY  04/08/2008   normal  . LIPOMA EXCISION     from back   Social History  Substance Use Topics  . Smoking status: Never Smoker  . Smokeless tobacco: Never  Used  . Alcohol use 0.0 oz/week     Comment: socially-couple of times a week   Family History  Problem Relation Age of Onset  . Diabetes type II Father   . Hypertension Father   . Cancer Father     Bladder  . Hypertension Mother   . Colon polyps Mother   . Anesthesia problems Mother   . Colon cancer Mother   . Breast cancer Maternal Grandmother     ? female cancer  . Cancer Maternal Grandmother     breast  . Hypotension Neg Hx   . Malignant hyperthermia Neg Hx   . Pseudochol deficiency Neg Hx    Allergies  Allergen Reactions  . Penicillins     REACTION: rash   Current Outpatient Prescriptions on File Prior to Visit  Medication Sig Dispense Refill  . Multiple Vitamin (MULTIVITAMIN) capsule Take 1 capsule by mouth daily.     No current facility-administered medications on file prior  to visit.      Review of Systems    Review of Systems  Constitutional: Negative for fever, appetite change, fatigue and unexpected weight change.  Eyes: Negative for pain and visual disturbance.  Respiratory: Negative for cough and shortness of breath.   Cardiovascular: Negative for cp or palpitations    Gastrointestinal: Negative for nausea, diarrhea and constipation.  Genitourinary: Negative for urgency and frequency.  Skin: Negative for pallor or rash   Neurological: Negative for weakness, light-headedness, numbness and headaches.  Hematological: Negative for adenopathy. Does not bruise/bleed easily.  Psychiatric/Behavioral: Negative for dysphoric mood. The patient is not nervous/anxious.pos for grief and stressors       Objective:   Physical Exam  Constitutional: She appears well-developed and well-nourished. No distress.  Well appearing   HENT:  Head: Normocephalic and atraumatic.  Right Ear: External ear normal.  Left Ear: External ear normal.  Mouth/Throat: Oropharynx is clear and moist.  Eyes: Conjunctivae and EOM are normal. Pupils are equal, round, and reactive to light. No scleral icterus.  Neck: Normal range of motion. Neck supple. No JVD present. Carotid bruit is not present. No thyromegaly present.  Cardiovascular: Normal rate, regular rhythm, normal heart sounds and intact distal pulses.  Exam reveals no gallop.   Pulmonary/Chest: Effort normal and breath sounds normal. No respiratory distress. She has no wheezes. She exhibits no tenderness.  Abdominal: Soft. Bowel sounds are normal. She exhibits no distension, no abdominal bruit and no mass. There is no tenderness.  Genitourinary: No breast swelling, tenderness, discharge or bleeding.  Genitourinary Comments: Breast exam: No mass, nodules, thickening, tenderness, bulging, retraction, inflamation, nipple discharge or skin changes noted.  No axillary or clavicular LA.             Anus appears normal w/o  hemorrhoids or masses     External genitalia : nl appearance and hair distribution/no lesions     Urethral meatus : nl size, no lesions or prolapse     Urethra: no masses, tenderness or scarring    Bladder : no masses or tenderness     Vagina: nl general appearance, no discharge or  Lesions, no significant cystocele  or rectocele     Cervix: no lesions/ discharge or friability    Uterus: nl size, contour, position, and mobility (not fixed) , non tender    Adnexa : no masses, tenderness, enlargement or nodularity        Musculoskeletal: Normal range of motion. She exhibits tenderness. She exhibits no edema.  Lymphadenopathy:  She has no cervical adenopathy.  Neurological: She is alert. She has normal reflexes. No cranial nerve deficit. She exhibits normal muscle tone. Coordination normal.  Skin: Skin is warm and dry. No rash noted. No erythema. No pallor.  Lentigines diffusely  Psychiatric: She has a normal mood and affect.          Assessment & Plan:   Problem List Items Addressed This Visit      Musculoskeletal and Integument   Osteoporosis    Refer for dexa  No falls or fx Disc imp of ca and d        Other   Encounter for routine gynecological examination    Routine exam and pap done No c/o      Relevant Orders   Cytology - PAP   Estrogen deficiency   Relevant Orders   DG Bone Density   Family history of colon cancer    I now recommend 5 year recall for colonoscopy- which would be due She will check with ins regarding coverage of this       Routine general medical examination at a health care facility - Primary    Reviewed health habits including diet and exercise and skin cancer prevention Reviewed appropriate screening tests for age  Also reviewed health mt list, fam hx and immunization status , as well as social and family history   See HPI Labs from [employer] reviewed  She will schedule her mammogram in the fall  Did ref for dexa   Will consider zoster vaccine if covered  utd other vaccines  New fam hx of colon cancer- I recommend 5 y recall now-pt will check with ins regarding coverage of this and call back (if not covered consider ifob or cologuard)

## 2016-03-01 NOTE — Progress Notes (Signed)
Pre visit review using our clinic review tool, if applicable. No additional management support is needed unless otherwise documented below in the visit note. 

## 2016-03-01 NOTE — Assessment & Plan Note (Signed)
Routine exam and pap done No c/o

## 2016-03-01 NOTE — Patient Instructions (Addendum)
If you are interested in a shingles/zoster vaccine - call your insurance to check on coverage,( you should not get it within 1 month of other vaccines) , then call us for a prescription  for it to take to a pharmacy that gives the shot , or make a nurse visit to get it here depending on your coverage  (often coverage starts at 5)   Your family history of colon cancer upgrades your screening status to a colonoscopy every 5 years  If you want to schedule that let us know  If not - we need to screen (check on ins coverage of cologuard) If neither is covered - we will give you our ifob stool blood test   You have low bone mass Try to get 1200-1500 mg of calcium per day with at least 1000 iu of vitamin D - for bone health  Exercise helps also   Stop at check out for ref for dexa   Labs look pretty good

## 2016-03-01 NOTE — Assessment & Plan Note (Signed)
I now recommend 5 year recall for colonoscopy- which would be due She will check with ins regarding coverage of this

## 2016-03-01 NOTE — Assessment & Plan Note (Signed)
Reviewed health habits including diet and exercise and skin cancer prevention Reviewed appropriate screening tests for age  Also reviewed health mt list, fam hx and immunization status , as well as social and family history   See HPI Labs from [employer] reviewed  She will schedule her mammogram in the fall  Did ref for dexa  Will consider zoster vaccine if covered  utd other vaccines  New fam hx of colon cancer- I recommend 5 y recall now-pt will check with ins regarding coverage of this and call back (if not covered consider ifob or cologuard)

## 2016-03-02 ENCOUNTER — Encounter: Payer: Self-pay | Admitting: Family Medicine

## 2016-03-02 DIAGNOSIS — Z800 Family history of malignant neoplasm of digestive organs: Secondary | ICD-10-CM

## 2016-03-04 NOTE — Telephone Encounter (Signed)
Order for screen colonoscopy Will route to [facility]

## 2016-03-05 LAB — CYTOLOGY - PAP
DIAGNOSIS: NEGATIVE
HPV (WINDOPATH): NOT DETECTED

## 2016-03-09 ENCOUNTER — Other Ambulatory Visit: Payer: BLUE CROSS/BLUE SHIELD

## 2016-03-09 ENCOUNTER — Encounter: Payer: Self-pay | Admitting: Internal Medicine

## 2016-03-17 ENCOUNTER — Ambulatory Visit
Admission: RE | Admit: 2016-03-17 | Discharge: 2016-03-17 | Disposition: A | Discharge status: Home / Self Care | Payer: BLUE CROSS/BLUE SHIELD | Source: Ambulatory Visit | Attending: Family Medicine | Admitting: Family Medicine

## 2016-03-17 DIAGNOSIS — M810 Age-related osteoporosis without current pathological fracture: Secondary | ICD-10-CM | POA: Diagnosis not present

## 2016-03-17 DIAGNOSIS — Z780 Asymptomatic menopausal state: Secondary | ICD-10-CM | POA: Diagnosis not present

## 2016-03-17 DIAGNOSIS — E2839 Other primary ovarian failure: Secondary | ICD-10-CM

## 2016-04-15 ENCOUNTER — Ambulatory Visit (INDEPENDENT_AMBULATORY_CARE_PROVIDER_SITE_OTHER): Payer: BLUE CROSS/BLUE SHIELD | Admitting: Internal Medicine

## 2016-04-15 ENCOUNTER — Encounter: Payer: Self-pay | Admitting: Internal Medicine

## 2016-04-15 VITALS — BP 128/68 | HR 88 | Ht 65.0 in | Wt 156.1 lb

## 2016-04-15 DIAGNOSIS — Z800 Family history of malignant neoplasm of digestive organs: Secondary | ICD-10-CM

## 2016-04-15 NOTE — Patient Instructions (Signed)
   See you in 2020! (Sooner if needed but I hope not)  I appreciate the opportunity to care for you. Gatha Mayer, MD, [facility]

## 2016-04-15 NOTE — Progress Notes (Signed)
   Heather Mcdonald 61 y.o. November 16, 1955 [id number]  Assessment & Plan:   Encounter Diagnosis  Name Primary?  . Family history of colon cancer in mother at age 24 Yes    The advanced age of her mother does not increase she was risk so she can present for a routine repeat colonoscopy 10 years after her last which will be 2020.  Subjective:   Chief Complaint: New diagnosis of family history of colon cancer  HPI Heather Mcdonald feels well no GI complaints. She reports that her mother was diagnosed with colon cancer had it resected for cure, at age 59. Her mother did have a history of polyps and had repeat colonoscopies. In 2010 Heather Mcdonald had a colonoscopy that was negative for polyps. I recommended a repeat in 2020.  She continues to work in [employer]. Her husband is in between jobs in IT right now. They are planning to build a home at the [place], on [place]. [place]'s near [facility].   Review of Systems Allergies anxiety arthritis symptoms at times all others negative  Objective:   Physical Exam BP 128/68 (BP Location: Left Arm, Patient Position: Sitting, Cuff Size: Normal)   Pulse 88   Ht 5\' 5"  (1.651 m) Comment: height measured without shoes  Wt 156 lb 2 oz (70.8 kg)   LMP 02/01/2010   BMI 25.98 kg/m  No acute distress  10 minutes time spent with the patient over half of which was in counseling and coordination of care

## 2016-04-15 NOTE — Addendum Note (Signed)
Addended by: Gatha Mayer on: 04/15/2016 05:16 PM   Modules accepted: Level of Service

## 2016-05-24 DIAGNOSIS — G4733 Obstructive sleep apnea (adult) (pediatric): Secondary | ICD-10-CM | POA: Diagnosis not present

## 2016-06-02 ENCOUNTER — Encounter: Payer: Self-pay | Admitting: Family Medicine

## 2016-10-25 DIAGNOSIS — L570 Actinic keratosis: Secondary | ICD-10-CM | POA: Diagnosis not present

## 2016-10-25 DIAGNOSIS — Z85828 Personal history of other malignant neoplasm of skin: Secondary | ICD-10-CM | POA: Diagnosis not present

## 2016-10-26 ENCOUNTER — Other Ambulatory Visit: Payer: Self-pay | Admitting: Family Medicine

## 2016-10-26 DIAGNOSIS — Z1231 Encounter for screening mammogram for malignant neoplasm of breast: Secondary | ICD-10-CM

## 2016-12-02 ENCOUNTER — Ambulatory Visit
Admission: RE | Admit: 2016-12-02 | Discharge: 2016-12-02 | Disposition: A | Discharge status: Home / Self Care | Payer: BLUE CROSS/BLUE SHIELD | Source: Ambulatory Visit | Attending: Family Medicine | Admitting: Family Medicine

## 2016-12-02 DIAGNOSIS — Z1231 Encounter for screening mammogram for malignant neoplasm of breast: Secondary | ICD-10-CM | POA: Diagnosis not present

## 2016-12-24 DIAGNOSIS — G4733 Obstructive sleep apnea (adult) (pediatric): Secondary | ICD-10-CM | POA: Diagnosis not present

## 2017-02-21 ENCOUNTER — Ambulatory Visit: Payer: BLUE CROSS/BLUE SHIELD | Admitting: Pulmonary Disease

## 2017-03-28 ENCOUNTER — Encounter: Payer: Self-pay | Admitting: Pulmonary Disease

## 2017-03-28 ENCOUNTER — Ambulatory Visit: Payer: BLUE CROSS/BLUE SHIELD | Admitting: Pulmonary Disease

## 2017-03-28 VITALS — BP 114/70 | HR 74 | Ht 65.0 in | Wt 153.0 lb

## 2017-03-28 DIAGNOSIS — G4733 Obstructive sleep apnea (adult) (pediatric): Secondary | ICD-10-CM | POA: Diagnosis not present

## 2017-03-28 NOTE — Patient Instructions (Signed)
Will have your home care company adjust your Bipap setting up  Call of email to update status after few weeks on new pressure   Follow up in 1 year

## 2017-03-28 NOTE — Progress Notes (Signed)
[facility]  Chief Complaint  Patient presents with  . Follow-up    Pt is not feeling rested with using cpap machine, and starting snoring loudly about 2 months ago.    Vital signs: BP 114/70 (BP Location: Left Arm, Cuff Size: Normal)   Pulse 74   Ht 5\' 5"  (1.651 m)   Wt 153 lb (69.4 kg)   LMP 02/01/2010   SpO2 98%   BMI 25.46 kg/m   History of Present Illness: Heather Mcdonald is a 62 y.o. female with obstructive sleep apnea.  She has noticed more trouble feeling sleepy during the day.  She is waking up once to use the bathroom.  Her husband says she is snoring more.  She isn't sure if this is all night or parts of the night.  She doesn't feel like her mask is leaking.  She uses Bipap every night.   Physical Exam:  General - pleasant Eyes - pupils reactive ENT - no sinus tenderness, no oral exudate, no LAN Cardiac - regular, no murmur Chest - no wheeze, rales Abd - soft, non tender Ext - no edema Skin - no rashes Neuro - normal strength Psych - normal mood  Assessment/Plan:  Obstructive sleep apnea. - she is compliant with Bipap  - Bipap helps, but she has been getting more daytime sleepiness and snoring - will change her Bipap to 10/7 cm H2O - if her symptoms persistent, then she might need her Bipap machine assessed or she might need repeat in lab titration study   Patient Instructions  Will have your home care company adjust your Bipap setting up  Call of email to update status after few weeks on new pressure   Follow up in 1 year   Time spent 26 minutes  Chesley Mires, MD [place] 03/28/2017, 12:07 PM Pager:  [phone]  Flow Sheet  Sleep tests: PSG 04/16/13 >> AHI of 23.2, SaO2 low of 87%. Unable to tolerate CPAP. BiPAP 9/5 >> AHI 0. BiPAP 12/28/16 to 03/27/17 >> used on 90 of 90 nights with average 7 hrs 18 min.  Average AHI 2.3 with Bipap 8/5 cm H2O  Past Medical History: She  has  a past medical history of Allergic rhinitis, Anxiety disorder, Basal cell carcinoma, Bronchitis (24+yrs ago), Fibrocystic breast disease, Foot fracture, Gallbladder dyskinesia (01/28/2011), Gestational diabetes, History of uterine fibroid, Joint pain, OSA (obstructive sleep apnea) (02/19/2013), Papanicolaou smear of cervix with atypical squamous cells of undetermined significance (ASC-US), RUQ pain, and Temporomandibular joint disorder (TMJ).  Past Surgical History: She  has a past surgical history that includes Lipoma excision; cervical cryo (1996); basal cell removal; Colonoscopy (04/08/2008); Breast cyst aspiration (Left, 9/11 and 2/12); and Cholecystectomy (03/02/2011).  Family History: Her family history includes Anesthesia problems in her mother; Bladder Cancer in her father; Breast cancer in her maternal grandmother; Colon cancer (age of onset: 69) in her mother; Colon polyps in her mother; Diabetes in her paternal grandmother; Diabetes type II in her father; Heart disease in her maternal grandfather and paternal grandfather; Hypertension in her father and mother; Stroke (age of onset: 54) in her paternal grandmother.  Social History: She  reports that  has never smoked. she has never used smokeless tobacco. She reports that she drinks alcohol. She reports that she does not use drugs.  Medications: Allergies as of 03/28/2017      Reactions   Penicillins    REACTION: rash      Medication List  Accurate as of 03/28/17 12:07 PM. Always use your most recent med list.          CALCIUM/VITAMIN D PO Take 1 tablet by mouth 2 (two) times daily.

## 2017-05-06 DIAGNOSIS — G4733 Obstructive sleep apnea (adult) (pediatric): Secondary | ICD-10-CM | POA: Diagnosis not present

## 2017-09-28 ENCOUNTER — Encounter: Payer: Self-pay | Admitting: Family Medicine

## 2017-09-28 ENCOUNTER — Ambulatory Visit (INDEPENDENT_AMBULATORY_CARE_PROVIDER_SITE_OTHER): Payer: BLUE CROSS/BLUE SHIELD | Admitting: Family Medicine

## 2017-09-28 VITALS — BP 126/72 | HR 72 | Temp 98.2°F | Ht 65.0 in | Wt 137.8 lb

## 2017-09-28 DIAGNOSIS — M810 Age-related osteoporosis without current pathological fracture: Secondary | ICD-10-CM

## 2017-09-28 DIAGNOSIS — Z0000 Encounter for general adult medical examination without abnormal findings: Secondary | ICD-10-CM

## 2017-09-28 DIAGNOSIS — Z800 Family history of malignant neoplasm of digestive organs: Secondary | ICD-10-CM | POA: Diagnosis not present

## 2017-09-28 MED ORDER — ALENDRONATE SODIUM 70 MG PO TABS: 70.0000 mg | ORAL_TABLET | ORAL | 3 refills | Status: DC

## 2017-09-28 NOTE — Progress Notes (Signed)
Subjective:    Patient ID: Heather Mcdonald, female    DOB: 04-21-1955, 62 y.o.   MRN: [id number]  HPI Here for health maintenance exam and to review chronic medical problems    Doing well  Planning her son's wedding in 2 weeks   Has been taking care of herself   Wt Readings from Last 3 Encounters:  09/28/17 137 lb 12 oz (62.5 kg)  03/28/17 153 lb (69.4 kg)  04/15/16 156 lb 2 oz (70.8 kg)  lost weight since march- has worked hard  [employer] program - lost 19 lb over 16 weeks /has kept her weight off  Feels good and at her goal !  Yoga and walking for exercise  22.92 kg/m   Flu shot -has to get at work  Colonoscopy 3/10 with a 54 y recall  Mother was diagnosed with colon cancer later in life No symptoms Wants to get ref to Dr Carlean Purl (who she has seen before)   Mammogram 11/18 neg  Self breast exam -no lumps or changes   Pap 2/18 neg with neg HPV screen No gyn problems No new partners   Tetanus shot 1/14  dexa 2/18 - low bone mass (osteoporosis in FN)  Decreased BMD from previous  Got it at work  No falls or fx Ca/D- taking that  Exercising    BP BP Readings from Last 3 Encounters:  09/28/17 126/72  03/28/17 114/70  04/15/16 128/68   Zoster status -interested in shingrix   Labs from work : Cr 1.19 GFR 58  Nl chem panel  Lipids- Total 183 Trig 92 HDL 61  A1C 5.6   Patient Active Problem List   Diagnosis Date Noted  . Family history of colon cancer mother at 41 03/01/2016  . Estrogen deficiency 03/01/2016  . Osteoporosis 03/01/2016  . Aerophagia 07/27/2013  . OSA (obstructive sleep apnea) 02/19/2013  . Routine general medical examination at a health care facility 02/18/2012  . Encounter for routine gynecological examination 02/18/2012  . ALLERGIC RHINITIS 12/22/2006  . FIBROCYSTIC BREAST DISEASE 12/22/2006   Past Medical History:  Diagnosis Date  . Allergic rhinitis   . Anxiety disorder   . Basal cell carcinoma   . Bronchitis  24+yrs ago  . Fibrocystic breast disease   . Foot fracture   . Gallbladder dyskinesia 01/28/2011  . Gestational diabetes   . History of uterine fibroid   . Joint pain   . OSA (obstructive sleep apnea) 02/19/2013  . Papanicolaou smear of cervix with atypical squamous cells of undetermined significance (ASC-US)   . RUQ pain   . Temporomandibular joint disorder (TMJ)    Past Surgical History:  Procedure Laterality Date  . basal cell removal     from face  . BREAST CYST ASPIRATION Left 9/11 and 2/12   benign  . cervical cryo  1996  . CHOLECYSTECTOMY  03/02/2011   Procedure: LAPAROSCOPIC CHOLECYSTECTOMY;  Surgeon: Haywood Lasso, MD;  Location: [facility];  Service: General;  Laterality: N/A;  attempted intraoperative cholangiogram  . COLONOSCOPY  04/08/2008   normal  . LIPOMA EXCISION     from back   Social History   Tobacco Use  . Smoking status: Never Smoker  . Smokeless tobacco: Never Used  Substance Use Topics  . Alcohol use: Yes    Alcohol/week: 0.0 standard drinks    Comment: socially-couple of times a week  . Drug use: No   Family History  Problem Relation Age of Onset  .  Diabetes type II Father   . Hypertension Father   . Bladder Cancer Father   . Hypertension Mother   . Colon polyps Mother   . Anesthesia problems Mother   . Colon cancer Mother 35  . Breast cancer Maternal Grandmother        ? female cancer  . Heart disease Maternal Grandfather   . Diabetes Paternal Grandmother   . Stroke Paternal Grandmother 19  . Heart disease Paternal Grandfather   . Hypotension Neg Hx   . Malignant hyperthermia Neg Hx   . Pseudochol deficiency Neg Hx    Allergies  Allergen Reactions  . Penicillins     REACTION: rash   Current Outpatient Medications on File Prior to Visit  Medication Sig Dispense Refill  . Calcium Carb-Cholecalciferol (CALCIUM/VITAMIN D PO) Take 1 tablet by mouth 2 (two) times daily.     No current facility-administered medications on file prior to  visit.     Review of Systems  Constitutional: Negative for activity change, appetite change, fatigue, fever and unexpected weight change.  HENT: Negative for congestion, ear pain, rhinorrhea, sinus pressure and sore throat.   Eyes: Negative for pain, redness and visual disturbance.  Respiratory: Negative for cough, shortness of breath and wheezing.   Cardiovascular: Negative for chest pain and palpitations.  Gastrointestinal: Negative for abdominal pain, blood in stool, constipation and diarrhea.  Endocrine: Negative for polydipsia and polyuria.  Genitourinary: Negative for dysuria, frequency and urgency.  Musculoskeletal: Negative for arthralgias, back pain and myalgias.  Skin: Negative for pallor and rash.  Allergic/Immunologic: Negative for environmental allergies.  Neurological: Negative for dizziness, syncope and headaches.  Hematological: Negative for adenopathy. Does not bruise/bleed easily.  Psychiatric/Behavioral: Negative for decreased concentration and dysphoric mood. The patient is not nervous/anxious.        Objective:   Physical Exam  Constitutional: She appears well-developed and well-nourished. No distress.  Well appearing   HENT:  Head: Normocephalic and atraumatic.  Right Ear: External ear normal.  Left Ear: External ear normal.  Mouth/Throat: Oropharynx is clear and moist.  Eyes: Pupils are equal, round, and reactive to light. Conjunctivae and EOM are normal. No scleral icterus.  Neck: Normal range of motion. Neck supple. No JVD present. Carotid bruit is not present. No thyromegaly present.  Cardiovascular: Normal rate, regular rhythm, normal heart sounds and intact distal pulses. Exam reveals no gallop.  Pulmonary/Chest: Effort normal and breath sounds normal. No respiratory distress. She has no wheezes. She exhibits no tenderness. No breast tenderness, discharge or bleeding.  Abdominal: Soft. Bowel sounds are normal. She exhibits no distension, no abdominal bruit  and no mass. There is no tenderness.  Genitourinary: No breast tenderness, discharge or bleeding.  Genitourinary Comments: Breast exam: No mass, nodules, thickening, tenderness, bulging, retraction, inflamation, nipple discharge or skin changes noted.  No axillary or clavicular LA.      Musculoskeletal: Normal range of motion. She exhibits no edema or tenderness.   No kyphosis   Lymphadenopathy:    She has no cervical adenopathy.  Neurological: She is alert. She has normal reflexes. She displays normal reflexes. No cranial nerve deficit. She exhibits normal muscle tone. Coordination normal.  Skin: Skin is warm and dry. No rash noted. No erythema. No pallor.  Solar lentigines diffusely   Psychiatric: She has a normal mood and affect.  Pleasant           Assessment & Plan:   Problem List Items Addressed This Visit  Musculoskeletal and Integument   Osteoporosis    Rev dexa from 2/18 with OP  Disc risk factors for fx  Agreeable to try alendronate (pros/cons and poss side eff rev) Handout given  No falls or fx Disc fall prev Continue exercise and ca plus D      Relevant Medications   alendronate (FOSAMAX) 70 MG tablet     Other   Family history of colon cancer mother at 66    Pt wants to schedule her recall colonoscopy  Ref to GI/has seen Dr Zachery Conch hx noted       Relevant Orders   Ambulatory referral to Gastroenterology   Routine general medical examination at a health care facility - Primary    Reviewed health habits including diet and exercise and skin cancer prevention Reviewed appropriate screening tests for age  Also reviewed health mt list, fam hx and immunization status , as well as social and family history   See HPI Labs rev from [facility]- reassuring dexa rev-will begin alendronate  Ref to GI for recall colonoscopy  Commended healthy habits and wt loss and mt

## 2017-09-28 NOTE — Assessment & Plan Note (Signed)
Rev dexa from 2/18 with OP  Disc risk factors for fx  Agreeable to try alendronate (pros/cons and poss side eff rev) Handout given  No falls or fx Disc fall prev Continue exercise and ca plus D

## 2017-09-28 NOTE — Assessment & Plan Note (Signed)
Pt wants to schedule her recall colonoscopy  Ref to GI/has seen Dr Zachery Conch hx noted

## 2017-09-28 NOTE — Assessment & Plan Note (Signed)
Reviewed health habits including diet and exercise and skin cancer prevention Reviewed appropriate screening tests for age  Also reviewed health mt list, fam hx and immunization status , as well as social and family history   See HPI Labs rev from [facility]- reassuring dexa rev-will begin alendronate  Ref to GI for recall colonoscopy  Commended healthy habits and wt loss and mt

## 2017-09-28 NOTE — Patient Instructions (Addendum)
Let's try alendronate for osteoporosis  Weekly- take as directed  If any side effects-call us and stop it  Keep exercising   If you are interested in the new shingles vaccine (Shingrix) - call your local pharmacy to check on coverage and availability  If affordable- get on a wait list at the pharmacy   We will work on your colonoscopy referral

## 2017-10-27 ENCOUNTER — Other Ambulatory Visit: Payer: Self-pay | Admitting: Family Medicine

## 2017-10-27 DIAGNOSIS — Z1231 Encounter for screening mammogram for malignant neoplasm of breast: Secondary | ICD-10-CM

## 2017-12-06 ENCOUNTER — Ambulatory Visit
Admission: RE | Admit: 2017-12-06 | Discharge: 2017-12-06 | Disposition: A | Discharge status: Home / Self Care | Payer: BLUE CROSS/BLUE SHIELD | Source: Ambulatory Visit | Attending: Family Medicine | Admitting: Family Medicine

## 2017-12-06 DIAGNOSIS — Z1231 Encounter for screening mammogram for malignant neoplasm of breast: Secondary | ICD-10-CM | POA: Diagnosis not present

## 2017-12-06 DIAGNOSIS — D2272 Melanocytic nevi of left lower limb, including hip: Secondary | ICD-10-CM | POA: Diagnosis not present

## 2017-12-06 DIAGNOSIS — D2262 Melanocytic nevi of left upper limb, including shoulder: Secondary | ICD-10-CM | POA: Diagnosis not present

## 2017-12-06 DIAGNOSIS — Z85828 Personal history of other malignant neoplasm of skin: Secondary | ICD-10-CM | POA: Diagnosis not present

## 2017-12-06 DIAGNOSIS — D2261 Melanocytic nevi of right upper limb, including shoulder: Secondary | ICD-10-CM | POA: Diagnosis not present

## 2017-12-06 DIAGNOSIS — L570 Actinic keratosis: Secondary | ICD-10-CM | POA: Diagnosis not present

## 2017-12-19 DIAGNOSIS — G4733 Obstructive sleep apnea (adult) (pediatric): Secondary | ICD-10-CM | POA: Diagnosis not present

## 2018-03-02 ENCOUNTER — Encounter: Payer: Self-pay | Admitting: Internal Medicine

## 2018-03-20 ENCOUNTER — Ambulatory Visit (AMBULATORY_SURGERY_CENTER): Payer: Self-pay

## 2018-03-20 ENCOUNTER — Encounter: Payer: Self-pay | Admitting: Internal Medicine

## 2018-03-20 VITALS — Ht 65.0 in | Wt 140.0 lb

## 2018-03-20 DIAGNOSIS — Z1211 Encounter for screening for malignant neoplasm of colon: Secondary | ICD-10-CM

## 2018-03-20 NOTE — Progress Notes (Signed)
Denies allergies to eggs or soy products. Denies complication of anesthesia or sedation. Denies use of weight loss medication. Denies use of O2.   Emmi instructions declined.  

## 2018-03-28 ENCOUNTER — Ambulatory Visit: Payer: BLUE CROSS/BLUE SHIELD | Admitting: Primary Care

## 2018-03-29 ENCOUNTER — Ambulatory Visit: Payer: BLUE CROSS/BLUE SHIELD | Admitting: Pulmonary Disease

## 2018-03-29 ENCOUNTER — Encounter: Payer: Self-pay | Admitting: Pulmonary Disease

## 2018-03-29 ENCOUNTER — Other Ambulatory Visit: Payer: Self-pay

## 2018-03-29 VITALS — BP 118/74 | HR 68 | Ht 65.0 in | Wt 140.0 lb

## 2018-03-29 DIAGNOSIS — G4733 Obstructive sleep apnea (adult) (pediatric): Secondary | ICD-10-CM | POA: Diagnosis not present

## 2018-03-29 NOTE — Patient Instructions (Signed)
Follow up in 1 year.

## 2018-03-29 NOTE — Progress Notes (Signed)
[facility], [facility] Care[facility]  Chief Complaint  Patient presents with  . Follow-up    Pt wakes up 1-2 times each night more often in last few months. Pt is having allergies- runny nose, itchy eyes, no fever no chills.    Constitutional:  BP 118/74 (BP Location: Left Arm, Cuff Size: Normal)   Pulse 68   Ht 5\' 5"  (1.651 m)   Wt 140 lb (63.5 kg)   LMP 02/01/2010   SpO2 98%   BMI 23.30 kg/m   Past Medical History:  TMJ, Osteoporosis, Allergies, Anxiety  Brief Summary:  Heather Mcdonald is a 63 y.o. female with obstructive sleep apnea.  She is doing well with Bipap.  No issues with mask fit.  Has lost about 15 lbs with [employer].  Still snores if she doesn't use Bipap.  Wakes up 1 or 2 times to use bathroom, but then goes back to sleep.  Feels rested during the day.  Physical Exam:   Appearance - well kempt   ENMT - clear nasal mucosa, midline nasal  septum, no oral exudates, no LAN, trachea midline  Respiratory - normal chest wall, normal respiratory effort, no accessory muscle use, no wheeze/rales  CV - s1s2 regular rate and rhythm, no murmurs, no peripheral edema, radial pulses symmetric  GI - soft, non tender, no masses  Lymph - no adenopathy noted in neck and axillary areas  MSK - normal gait  Ext - no cyanosis, clubbing, or joint inflammation noted  Skin - no rashes, lesions, or ulcers  Neuro - normal strength, oriented x 3  Psych - normal mood and affect    Assessment/Plan:   Obstructive sleep apnea. - she is compliant with therapy - continue Bipap 10/7 cm H2O - she will call if she wants to get sleep study to assess current status of sleep apnea after weight loss   Patient Instructions  Follow up in 1 year    Chesley Mires, MD [place] Pager: [phone] 03/29/2018, 12:10 PM  Flow Sheet    Sleep tests:  PSG 04/16/13 >>AHI of 23.2, SaO2 low of 87%. Unable to tolerate CPAP. BiPAP 9/5 >>AHI  0. BiPAP 02/27/18 to 03/28/18 >> used on 30 of 30 nights with average 6 hrs 50 min.  Average AHI 1.2 with Bipap 10/7 cm H2O  Medications:   Allergies as of 03/29/2018      Reactions   Penicillins    REACTION: rash      Medication List       Accurate as of March 29, 2018 12:10 PM. Always use your most recent med list.        alendronate 70 MG tablet Commonly known as:  FOSAMAX Take 1 tablet (70 mg total) by mouth every 7 (seven) days. Take with a full glass of water on an empty stomach.   CALCIUM/VITAMIN D PO Take 1 tablet by mouth 2 (two) times daily.   polyethylene glycol powder powder Commonly known as:  GLYCOLAX/MIRALAX Take 1 Container by mouth once.       Past Surgical History:  She  has a past surgical history that includes Lipoma excision; cervical cryo (1996); basal cell removal; Colonoscopy (04/08/2008); Breast cyst aspiration (Left, 9/11 and 2/12); and Cholecystectomy (03/02/2011).  Family History:  Her family history includes Anesthesia problems in her mother; Bladder Cancer in her father; Breast cancer in her maternal grandmother; Colon cancer (age of onset: 40) in her mother; Colon polyps in her mother; Diabetes  in her paternal grandmother; Diabetes type II in her father; Heart disease in her maternal grandfather and paternal grandfather; Hypertension in her father and mother; Stroke (age of onset: 36) in her paternal grandmother.  Social History:  She  reports that she has never smoked. She has never used smokeless tobacco. She reports current alcohol use. She reports that she does not use drugs.

## 2018-04-03 ENCOUNTER — Ambulatory Visit (AMBULATORY_SURGERY_CENTER): Payer: BLUE CROSS/BLUE SHIELD | Admitting: Internal Medicine

## 2018-04-03 ENCOUNTER — Encounter: Payer: Self-pay | Admitting: Internal Medicine

## 2018-04-03 ENCOUNTER — Other Ambulatory Visit: Payer: Self-pay

## 2018-04-03 VITALS — BP 130/66 | HR 57 | Temp 99.1°F | Resp 14 | Ht 65.0 in | Wt 140.0 lb

## 2018-04-03 DIAGNOSIS — D123 Benign neoplasm of transverse colon: Secondary | ICD-10-CM

## 2018-04-03 DIAGNOSIS — K635 Polyp of colon: Secondary | ICD-10-CM | POA: Diagnosis not present

## 2018-04-03 DIAGNOSIS — Z1211 Encounter for screening for malignant neoplasm of colon: Secondary | ICD-10-CM | POA: Diagnosis not present

## 2018-04-03 MED ORDER — SODIUM CHLORIDE 0.9 % IV SOLN: 500.0000 mL | Freq: Once | INTRAVENOUS | Status: DC

## 2018-04-03 NOTE — Progress Notes (Signed)
Pt's states no medical or surgical changes since previsit or office visit. 

## 2018-04-03 NOTE — Patient Instructions (Addendum)
   I found and removed one tiny polyp.  I will let you know pathology results and when to have another routine colonoscopy by mail and/or My Chart.  I appreciate the opportunity to care for you. Gatha Mayer, MD, [facility]   Handout given for polyps. YOU HAD AN ENDOSCOPIC PROCEDURE TODAY AT [facility]:   Refer to the procedure report that was given to you for any specific questions about what was found during the examination.  If the procedure report does not answer your questions, please call your gastroenterologist to clarify.  If you requested that your care partner not be given the details of your procedure findings, then the procedure report has been included in a sealed envelope for you to review at your convenience later.  YOU SHOULD EXPECT: Some feelings of bloating in the abdomen. Passage of more gas than usual.  Walking can help get rid of the air that was put into your GI tract during the procedure and reduce the bloating. If you had a lower endoscopy (such as a colonoscopy or flexible sigmoidoscopy) you may notice spotting of blood in your stool or on the toilet paper. If you underwent a bowel prep for your procedure, you may not have a normal bowel movement for a few days.  Please Note:  You might notice some irritation and congestion in your nose or some drainage.  This is from the oxygen used during your procedure.  There is no need for concern and it should clear up in a day or so.  SYMPTOMS TO REPORT IMMEDIATELY:   Following lower endoscopy (colonoscopy or flexible sigmoidoscopy):  Excessive amounts of blood in the stool  Significant tenderness or worsening of abdominal pains  Swelling of the abdomen that is new, acute  Fever of 100F or higher  For urgent or emergent issues, a gastroenterologist can be reached at any hour by calling [phone].   DIET:  We do recommend a small meal at first, but then you may proceed to your regular diet.  Drink  plenty of fluids but you should avoid alcoholic beverages for 24 hours.  ACTIVITY:  You should plan to take it easy for the rest of today and you should NOT DRIVE or use heavy machinery until tomorrow (because of the sedation medicines used during the test).    FOLLOW UP: Our staff will call the number listed on your records the next business day following your procedure to check on you and address any questions or concerns that you may have regarding the information given to you following your procedure. If we do not reach you, we will leave a message.  However, if you are feeling well and you are not experiencing any problems, there is no need to return our call.  We will assume that you have returned to your regular daily activities without incident.  If any biopsies were taken you will be contacted by phone or by letter within the next 1-3 weeks.  Please call us at [phone] if you have not heard about the biopsies in 3 weeks.    SIGNATURES/CONFIDENTIALITY: You and/or your care partner have signed paperwork which will be entered into your electronic medical record.  These signatures attest to the fact that that the information above on your After Visit Summary has been reviewed and is understood.  Full responsibility of the confidentiality of this discharge information lies with you and/or your care-partner.

## 2018-04-03 NOTE — Progress Notes (Signed)
PT taken to PACU. Monitors in place. VSS. Report given to RN. 

## 2018-04-03 NOTE — Op Note (Signed)
[facility] Patient Name: Heather Mcdonald Procedure Date: 04/03/2018 8:23 AM MRN: [id number] Endoscopist: Gatha Mayer , MD Age: 63 Referring MD:  Date of Birth: [birth year] Gender: Female Account #: [id number] Procedure:                Colonoscopy Indications:              Screening for colorectal malignant neoplasm, Last                            colonoscopy: 2010 Medicines:                Propofol per Anesthesia, Monitored Anesthesia Care Procedure:                Pre-Anesthesia Assessment:                           - Prior to the procedure, a History and Physical                            was performed, and patient medications and                            allergies were reviewed. The patient's tolerance of                            previous anesthesia was also reviewed. The risks                            and benefits of the procedure and the sedation                            options and risks were discussed with the patient.                            All questions were answered, and informed consent                            was obtained. Prior Anticoagulants: The patient has                            taken no previous anticoagulant or antiplatelet                            agents. ASA Grade Assessment: II - A patient with                            mild systemic disease. After reviewing the risks                            and benefits, the patient was deemed in                            satisfactory condition to undergo the procedure.  After obtaining informed consent, the colonoscope                            was passed under direct vision. Throughout the                            procedure, the patient's blood pressure, pulse, and                            oxygen saturations were monitored continuously. The                            Colonoscope was introduced through the anus and                            advanced to the  the cecum, identified by                            appendiceal orifice and ileocecal valve. The                            colonoscopy was somewhat difficult due to                            significant looping. Successful completion of the                            procedure was aided by applying abdominal pressure.                            The patient tolerated the procedure well. The                            quality of the bowel preparation was good. The                            ileocecal valve, appendiceal orifice, and rectum                            were photographed. The bowel preparation used was                            Miralax. Scope In: 8:28:54 AM Scope Out: 8:48:23 AM Scope Withdrawal Time: 0 hours 12 minutes 59 seconds  Total Procedure Duration: 0 hours 19 minutes 29 seconds  Findings:                 The perianal and digital rectal examinations were                            normal.                           A 1 to 2 mm polyp was found in the transverse  colon. The polyp was sessile. The polyp was removed                            with a cold snare. Resection and retrieval were                            complete. Verification of patient identification                            for the specimen was done. Estimated blood loss was                            minimal.                           The exam was otherwise without abnormality on                            direct and retroflexion views. Complications:            No immediate complications. Estimated Blood Loss:     Estimated blood loss was minimal. Impression:               - One 1 to 2 mm polyp in the transverse colon,                            removed with a cold snare. Resected and retrieved.                           - The examination was otherwise normal on direct                            and retroflexion views.                           Mother had colon cancer dx age  42 Recommendation:           - Patient has a contact number available for                            emergencies. The signs and symptoms of potential                            delayed complications were discussed with the                            patient. Return to normal activities tomorrow.                            Written discharge instructions were provided to the                            patient.                           - Resume previous  diet.                           - Continue present medications.                           - Repeat colonoscopy is recommended. The                            colonoscopy date will be determined after pathology                            results from today's exam become available for                            review. Gatha Mayer, MD 04/03/2018 8:57:01 AM This report has been signed electronically.

## 2018-04-03 NOTE — Progress Notes (Signed)
Called to room to assist during endoscopic procedure.  Patient ID and intended procedure confirmed with present staff. Received instructions for my participation in the procedure from the performing physician.  

## 2018-04-04 ENCOUNTER — Telehealth: Payer: Self-pay

## 2018-04-04 NOTE — Telephone Encounter (Signed)
  Follow up Call-  Call back number 04/03/2018  Post procedure Call Back phone  # [phone]  Permission to leave phone message Yes  Some recent data might be hidden     Patient questions:  Do you have a fever, pain , or abdominal swelling? No. Pain Score  0 *  Have you tolerated food without any problems? Yes.    Have you been able to return to your normal activities? Yes.    Do you have any questions about your discharge instructions: Diet   No. Medications  No. Follow up visit  No.  Do you have questions or concerns about your Care? No.  Actions: * If pain score is 4 or above: No action needed, pain <4.

## 2018-04-05 DIAGNOSIS — G4733 Obstructive sleep apnea (adult) (pediatric): Secondary | ICD-10-CM | POA: Diagnosis not present

## 2018-04-10 ENCOUNTER — Encounter: Payer: Self-pay | Admitting: Internal Medicine

## 2018-04-10 NOTE — Progress Notes (Signed)
Not precancerous polyp 10 yr recall 2030 Letter sent by My Chart

## 2018-05-10 DIAGNOSIS — G4733 Obstructive sleep apnea (adult) (pediatric): Secondary | ICD-10-CM | POA: Diagnosis not present

## 2018-06-20 DIAGNOSIS — H109 Unspecified conjunctivitis: Secondary | ICD-10-CM | POA: Diagnosis not present

## 2018-07-17 DIAGNOSIS — G4733 Obstructive sleep apnea (adult) (pediatric): Secondary | ICD-10-CM | POA: Diagnosis not present

## 2018-08-25 ENCOUNTER — Other Ambulatory Visit: Payer: Self-pay | Admitting: Family Medicine

## 2018-10-02 ENCOUNTER — Other Ambulatory Visit: Payer: Self-pay

## 2018-10-02 ENCOUNTER — Ambulatory Visit (INDEPENDENT_AMBULATORY_CARE_PROVIDER_SITE_OTHER): Payer: BC Managed Care – PPO | Admitting: Family Medicine

## 2018-10-02 ENCOUNTER — Encounter: Payer: Self-pay | Admitting: Family Medicine

## 2018-10-02 VITALS — BP 132/80 | HR 63 | Temp 97.3°F | Ht 64.75 in | Wt 142.1 lb

## 2018-10-02 DIAGNOSIS — M810 Age-related osteoporosis without current pathological fracture: Secondary | ICD-10-CM

## 2018-10-02 DIAGNOSIS — Z1231 Encounter for screening mammogram for malignant neoplasm of breast: Secondary | ICD-10-CM | POA: Insufficient documentation

## 2018-10-02 DIAGNOSIS — Z0000 Encounter for general adult medical examination without abnormal findings: Secondary | ICD-10-CM | POA: Diagnosis not present

## 2018-10-02 DIAGNOSIS — E2839 Other primary ovarian failure: Secondary | ICD-10-CM

## 2018-10-02 MED ORDER — ALENDRONATE SODIUM 70 MG PO TABS: ORAL_TABLET | ORAL | 3 refills | Status: DC

## 2018-10-02 NOTE — Assessment & Plan Note (Signed)
Scheduled annual screening mammogram Nl breast exam today  Encouraged monthly self exams   

## 2018-10-02 NOTE — Assessment & Plan Note (Signed)
Reviewed health habits including diet and exercise and skin cancer prevention Reviewed appropriate screening tests for age  Also reviewed health mt list, fam hx and immunization status , as well as social and family history   See HPI Labs reviewed dexa and mammogram orders done She will get flu shot at work  Colonoscopy utd  Good health habits PHQ score 0  Labs rev from [facility] - good lipid profile

## 2018-10-02 NOTE — Patient Instructions (Addendum)
Mammogram is due in November - don't forget to schedule it   Labs look good  Keep up a good fluid intake   Stop at check out to schedule mammogram and dexa   Keep up yoga and walking

## 2018-10-02 NOTE — Assessment & Plan Note (Signed)
Due for dexa Order done  Tolerating alendronate- 1 year  Plan to continue for 5 y if tolerated well  Disc supplements and exercise No falls or fx

## 2018-10-02 NOTE — Progress Notes (Signed)
Subjective:    Patient ID: Heather Mcdonald, female    DOB: 11/03/1955, 63 y.o.   MRN: [id number]  HPI Here for health maintenance exam and to review chronic medical problems    Weight  Wt Readings from Last 3 Encounters:  10/02/18 142 lb 2 oz (64.5 kg)  04/03/18 140 lb (63.5 kg)  03/29/18 140 lb (63.5 kg)  wt is good  Still eats healthy   Walking and does yoga for exercise  23.83 kg/m   Flu vaccine -has to get at work   Pap 2/18 negative  No gyn issues or problem  No vaginal bleeding   Mammogram 11/19  Self breast exam -no lumps   Tdap vaccine 1/14 shingrix vaccines - recent   colonosocpy 3/20 with 10 y recall (one small polyp) Mother had colon cancer at 32  H/o osteoporosis  dexa 2/18  Taking alendronate (1 year) Falls -one / she was trying to jump over a ditch  Fractures - no fractures  Supplements takes D and ca  Exercise -walking   [facility] labs-pt brought copy Cr 1.09 She tries to drink fluids  Glucose 90 GFR 56  Nl chemistries  Lipids Total chol 173 Trig 91 HDL 68 LDL 87  A1C is 5.5   PHQ : 0  Patient Active Problem List   Diagnosis Date Noted  . Screening mammogram, encounter for 10/02/2018  . Family history of colon cancer mother at 53 03/01/2016  . Estrogen deficiency 03/01/2016  . Osteoporosis 03/01/2016  . Aerophagia 07/27/2013  . OSA (obstructive sleep apnea) 02/19/2013  . Routine general medical examination at a health care facility 02/18/2012  . Encounter for routine gynecological examination 02/18/2012  . ALLERGIC RHINITIS 12/22/2006  . FIBROCYSTIC BREAST DISEASE 12/22/2006   Past Medical History:  Diagnosis Date  . Allergic rhinitis   . Allergy   . Anxiety disorder   . Basal cell carcinoma   . Bronchitis 24+yrs ago  . Fibrocystic breast disease   . Foot fracture   . Gallbladder dyskinesia 01/28/2011  . Gestational diabetes   . History of uterine fibroid   . Joint pain   . OSA (obstructive sleep apnea) 02/19/2013  .  Osteoporosis   . Papanicolaou smear of cervix with atypical squamous cells of undetermined significance (ASC-US)   . RUQ pain   . Sleep apnea    On CPAP  . Temporomandibular joint disorder (TMJ)    Past Surgical History:  Procedure Laterality Date  . basal cell removal     from face  . BREAST CYST ASPIRATION Left 9/11 and 2/12   benign  . cervical cryo  1996  . CHOLECYSTECTOMY  03/02/2011   Procedure: LAPAROSCOPIC CHOLECYSTECTOMY;  Surgeon: Haywood Lasso, MD;  Location: [facility];  Service: General;  Laterality: N/A;  attempted intraoperative cholangiogram  . COLONOSCOPY  04/08/2008   normal  . LIPOMA EXCISION     from back   Social History   Tobacco Use  . Smoking status: Never Smoker  . Smokeless tobacco: Never Used  Substance Use Topics  . Alcohol use: Yes    Alcohol/week: 0.0 standard drinks    Comment: socially-couple of times a week  . Drug use: No   Family History  Problem Relation Age of Onset  . Diabetes type II Father   . Hypertension Father   . Bladder Cancer Father   . Hypertension Mother   . Colon polyps Mother   . Anesthesia problems Mother   . Colon  cancer Mother 25  . Breast cancer Maternal Grandmother        ? female cancer  . Heart disease Maternal Grandfather   . Diabetes Paternal Grandmother   . Stroke Paternal Grandmother 7  . Heart disease Paternal Grandfather   . Hypotension Neg Hx   . Malignant hyperthermia Neg Hx   . Pseudochol deficiency Neg Hx   . Esophageal cancer Neg Hx   . Rectal cancer Neg Hx   . Stomach cancer Neg Hx    Allergies  Allergen Reactions  . Penicillins     REACTION: rash   Current Outpatient Medications on File Prior to Visit  Medication Sig Dispense Refill  . Calcium Carb-Cholecalciferol (CALCIUM/VITAMIN D PO) Take 1 tablet by mouth 2 (two) times daily.     No current facility-administered medications on file prior to visit.     Review of Systems  Constitutional: Negative for activity change, appetite  change, fatigue, fever and unexpected weight change.  HENT: Negative for congestion, ear pain, rhinorrhea, sinus pressure and sore throat.   Eyes: Negative for pain, redness and visual disturbance.  Respiratory: Negative for cough, shortness of breath and wheezing.   Cardiovascular: Negative for chest pain and palpitations.  Gastrointestinal: Negative for abdominal pain, blood in stool, constipation and diarrhea.  Endocrine: Negative for polydipsia and polyuria.  Genitourinary: Negative for dysuria, frequency and urgency.  Musculoskeletal: Negative for arthralgias, back pain and myalgias.  Skin: Negative for pallor and rash.  Allergic/Immunologic: Negative for environmental allergies.  Neurological: Negative for dizziness, syncope and headaches.  Hematological: Negative for adenopathy. Does not bruise/bleed easily.  Psychiatric/Behavioral: Negative for decreased concentration and dysphoric mood. The patient is not nervous/anxious.        Objective:   Physical Exam Constitutional:      General: She is not in acute distress.    Appearance: Normal appearance. She is well-developed. She is not ill-appearing or diaphoretic.  HENT:     Head: Normocephalic and atraumatic.     Right Ear: Tympanic membrane, ear canal and external ear normal.     Left Ear: Tympanic membrane, ear canal and external ear normal.     Nose: Nose normal. No congestion.     Mouth/Throat:     Mouth: Mucous membranes are moist.     Pharynx: Oropharynx is clear. No posterior oropharyngeal erythema.  Eyes:     General: No scleral icterus.    Extraocular Movements: Extraocular movements intact.     Conjunctiva/sclera: Conjunctivae normal.     Pupils: Pupils are equal, round, and reactive to light.  Neck:     Musculoskeletal: Normal range of motion and neck supple. No neck rigidity or muscular tenderness.     Thyroid: No thyromegaly.     Vascular: No carotid bruit or JVD.  Cardiovascular:     Rate and Rhythm: Normal  rate and regular rhythm.     Pulses: Normal pulses.     Heart sounds: Normal heart sounds. No gallop.   Pulmonary:     Effort: Pulmonary effort is normal. No respiratory distress.     Breath sounds: Normal breath sounds. No wheezing.     Comments: Good air exch Chest:     Chest wall: No tenderness.  Abdominal:     General: Bowel sounds are normal. There is no distension or abdominal bruit.     Palpations: Abdomen is soft. There is no mass.     Tenderness: There is no abdominal tenderness.     Hernia: No hernia  is present.  Genitourinary:    Comments: Breast exam: No mass, nodules, thickening, tenderness, bulging, retraction, inflamation, nipple discharge or skin changes noted.  No axillary or clavicular LA.     Musculoskeletal: Normal range of motion.        General: No tenderness.     Right lower leg: No edema.     Left lower leg: No edema.     Comments: No kyphosis   Lymphadenopathy:     Cervical: No cervical adenopathy.  Skin:    General: Skin is warm and dry.     Coloration: Skin is not pale.     Findings: No erythema or rash.     Comments: Solar lentigines diffusely   Neurological:     Mental Status: She is alert. Mental status is at baseline.     Cranial Nerves: No cranial nerve deficit.     Motor: No abnormal muscle tone.     Coordination: Coordination normal.     Gait: Gait normal.     Deep Tendon Reflexes: Reflexes are normal and symmetric.  Psychiatric:        Mood and Affect: Mood normal.           Assessment & Plan:   Problem List Items Addressed This Visit      Musculoskeletal and Integument   Osteoporosis    Due for dexa Order done  Tolerating alendronate- 1 year  Plan to continue for 5 y if tolerated well  Disc supplements and exercise No falls or fx      Relevant Medications   alendronate (FOSAMAX) 70 MG tablet     Other   Routine general medical examination at a health care facility - Primary    Reviewed health habits including diet and  exercise and skin cancer prevention Reviewed appropriate screening tests for age  Also reviewed health mt list, fam hx and immunization status , as well as social and family history   See HPI Labs reviewed dexa and mammogram orders done She will get flu shot at work  Colonoscopy utd  Good health habits PHQ score 0  Labs rev from [facility] - good lipid profile       Estrogen deficiency   Relevant Orders   DG Bone Density   Screening mammogram, encounter for    Scheduled annual screening mammogram Nl breast exam today  Encouraged monthly self exams        Relevant Orders   MM 3D SCREEN BREAST BILATERAL

## 2018-10-06 DIAGNOSIS — G4733 Obstructive sleep apnea (adult) (pediatric): Secondary | ICD-10-CM | POA: Diagnosis not present

## 2018-11-08 DIAGNOSIS — H5213 Myopia, bilateral: Secondary | ICD-10-CM | POA: Diagnosis not present

## 2018-12-05 DIAGNOSIS — L821 Other seborrheic keratosis: Secondary | ICD-10-CM | POA: Diagnosis not present

## 2018-12-05 DIAGNOSIS — L570 Actinic keratosis: Secondary | ICD-10-CM | POA: Diagnosis not present

## 2018-12-05 DIAGNOSIS — D485 Neoplasm of uncertain behavior of skin: Secondary | ICD-10-CM | POA: Diagnosis not present

## 2018-12-05 DIAGNOSIS — X32XXXA Exposure to sunlight, initial encounter: Secondary | ICD-10-CM | POA: Diagnosis not present

## 2018-12-05 DIAGNOSIS — Z85828 Personal history of other malignant neoplasm of skin: Secondary | ICD-10-CM | POA: Diagnosis not present

## 2018-12-05 DIAGNOSIS — C441192 Basal cell carcinoma of skin of left lower eyelid, including canthus: Secondary | ICD-10-CM | POA: Diagnosis not present

## 2018-12-05 DIAGNOSIS — Z08 Encounter for follow-up examination after completed treatment for malignant neoplasm: Secondary | ICD-10-CM | POA: Diagnosis not present

## 2018-12-26 DIAGNOSIS — C44111 Basal cell carcinoma of skin of unspecified eyelid, including canthus: Secondary | ICD-10-CM | POA: Diagnosis not present

## 2018-12-26 DIAGNOSIS — H0289 Other specified disorders of eyelid: Secondary | ICD-10-CM | POA: Diagnosis not present

## 2018-12-26 DIAGNOSIS — C4491 Basal cell carcinoma of skin, unspecified: Secondary | ICD-10-CM | POA: Diagnosis not present

## 2018-12-27 ENCOUNTER — Ambulatory Visit
Admission: RE | Admit: 2018-12-27 | Discharge: 2018-12-27 | Disposition: A | Discharge status: Home / Self Care | Payer: BC Managed Care – PPO | Source: Ambulatory Visit | Attending: Family Medicine | Admitting: Family Medicine

## 2018-12-27 ENCOUNTER — Other Ambulatory Visit: Payer: Self-pay

## 2018-12-27 DIAGNOSIS — Z1231 Encounter for screening mammogram for malignant neoplasm of breast: Secondary | ICD-10-CM

## 2018-12-27 DIAGNOSIS — Z780 Asymptomatic menopausal state: Secondary | ICD-10-CM | POA: Diagnosis not present

## 2018-12-27 DIAGNOSIS — M8588 Other specified disorders of bone density and structure, other site: Secondary | ICD-10-CM | POA: Diagnosis not present

## 2018-12-27 DIAGNOSIS — M810 Age-related osteoporosis without current pathological fracture: Secondary | ICD-10-CM | POA: Diagnosis not present

## 2018-12-27 DIAGNOSIS — E2839 Other primary ovarian failure: Secondary | ICD-10-CM

## 2018-12-28 ENCOUNTER — Other Ambulatory Visit: Payer: Self-pay | Admitting: Family Medicine

## 2018-12-28 DIAGNOSIS — R928 Other abnormal and inconclusive findings on diagnostic imaging of breast: Secondary | ICD-10-CM

## 2018-12-31 ENCOUNTER — Encounter: Payer: Self-pay | Admitting: Family Medicine

## 2019-01-01 ENCOUNTER — Other Ambulatory Visit: Payer: BC Managed Care – PPO

## 2019-01-02 ENCOUNTER — Ambulatory Visit
Admission: RE | Admit: 2019-01-02 | Discharge: 2019-01-02 | Disposition: A | Discharge status: Home / Self Care | Payer: BC Managed Care – PPO | Source: Ambulatory Visit | Attending: Family Medicine | Admitting: Family Medicine

## 2019-01-02 ENCOUNTER — Other Ambulatory Visit: Payer: Self-pay

## 2019-01-02 DIAGNOSIS — R922 Inconclusive mammogram: Secondary | ICD-10-CM | POA: Diagnosis not present

## 2019-01-02 DIAGNOSIS — R928 Other abnormal and inconclusive findings on diagnostic imaging of breast: Secondary | ICD-10-CM

## 2019-01-02 DIAGNOSIS — N6489 Other specified disorders of breast: Secondary | ICD-10-CM | POA: Diagnosis not present

## 2019-02-19 DIAGNOSIS — G4733 Obstructive sleep apnea (adult) (pediatric): Secondary | ICD-10-CM | POA: Diagnosis not present

## 2019-03-03 DIAGNOSIS — Z01812 Encounter for preprocedural laboratory examination: Secondary | ICD-10-CM | POA: Diagnosis not present

## 2019-03-03 DIAGNOSIS — Z20822 Contact with and (suspected) exposure to covid-19: Secondary | ICD-10-CM | POA: Diagnosis not present

## 2019-03-05 DIAGNOSIS — I10 Essential (primary) hypertension: Secondary | ICD-10-CM | POA: Diagnosis not present

## 2019-03-05 DIAGNOSIS — Z880 Allergy status to penicillin: Secondary | ICD-10-CM | POA: Diagnosis not present

## 2019-03-05 DIAGNOSIS — L578 Other skin changes due to chronic exposure to nonionizing radiation: Secondary | ICD-10-CM | POA: Diagnosis not present

## 2019-03-05 DIAGNOSIS — C441192 Basal cell carcinoma of skin of left lower eyelid, including canthus: Secondary | ICD-10-CM | POA: Diagnosis not present

## 2019-03-05 DIAGNOSIS — L988 Other specified disorders of the skin and subcutaneous tissue: Secondary | ICD-10-CM | POA: Diagnosis not present

## 2019-03-05 DIAGNOSIS — M810 Age-related osteoporosis without current pathological fracture: Secondary | ICD-10-CM | POA: Diagnosis not present

## 2019-03-05 DIAGNOSIS — H0289 Other specified disorders of eyelid: Secondary | ICD-10-CM | POA: Diagnosis not present

## 2019-03-05 DIAGNOSIS — Z85828 Personal history of other malignant neoplasm of skin: Secondary | ICD-10-CM | POA: Diagnosis not present

## 2019-03-05 DIAGNOSIS — Z79899 Other long term (current) drug therapy: Secondary | ICD-10-CM | POA: Diagnosis not present

## 2019-03-05 DIAGNOSIS — L814 Other melanin hyperpigmentation: Secondary | ICD-10-CM | POA: Diagnosis not present

## 2019-03-23 ENCOUNTER — Ambulatory Visit: Payer: Self-pay | Attending: Internal Medicine

## 2019-03-23 DIAGNOSIS — Z23 Encounter for immunization: Secondary | ICD-10-CM | POA: Insufficient documentation

## 2019-03-23 NOTE — Progress Notes (Signed)
   Covid-19 Vaccination Clinic  Name:  Heather Mcdonald    MRN: [id number] DOB: [birth year]  03/23/2019  [name] was observed post Covid-19 immunization for 15 minutes without incident. She was provided with Vaccine Information Sheet and instruction to access the V-Safe system.   [name] was instructed to call 911 with any severe reactions post vaccine: Marland Kitchen Difficulty breathing  . Swelling of face and throat  . A fast heartbeat  . A bad rash all over body  . Dizziness and weakness

## 2019-03-30 ENCOUNTER — Ambulatory Visit: Payer: Self-pay | Admitting: Pulmonary Disease

## 2019-04-10 DIAGNOSIS — M25511 Pain in right shoulder: Secondary | ICD-10-CM | POA: Diagnosis not present

## 2019-04-17 DIAGNOSIS — M25511 Pain in right shoulder: Secondary | ICD-10-CM | POA: Diagnosis not present

## 2019-04-18 ENCOUNTER — Ambulatory Visit: Payer: Self-pay | Attending: Internal Medicine

## 2019-04-18 DIAGNOSIS — Z23 Encounter for immunization: Secondary | ICD-10-CM

## 2019-04-18 NOTE — Progress Notes (Signed)
   Covid-19 Vaccination Clinic  Name:  CELES MOSSEY    MRN: [id number] DOB: 1955/03/27  04/18/2019  Ms. Seever was observed post Covid-19 immunization for 15 minutes without incident. She was provided with Vaccine Information Sheet and instruction to access the V-Safe system.   Ms. Mckeough was instructed to call 911 with any severe reactions post vaccine: Marland Kitchen Difficulty breathing  . Swelling of face and throat  . A fast heartbeat  . A bad rash all over body  . Dizziness and weakness   Immunizations Administered    Name Date Dose VIS Date Route   Pfizer COVID-19 Vaccine 04/18/2019  2:56 PM 0.3 mL 12/29/2018 Intramuscular   Manufacturer: [place]   Lot: [id number]   [place]: [id number]

## 2019-04-24 DIAGNOSIS — M25511 Pain in right shoulder: Secondary | ICD-10-CM | POA: Diagnosis not present

## 2019-05-14 DIAGNOSIS — D2261 Melanocytic nevi of right upper limb, including shoulder: Secondary | ICD-10-CM | POA: Diagnosis not present

## 2019-05-14 DIAGNOSIS — L570 Actinic keratosis: Secondary | ICD-10-CM | POA: Diagnosis not present

## 2019-05-14 DIAGNOSIS — D2262 Melanocytic nevi of left upper limb, including shoulder: Secondary | ICD-10-CM | POA: Diagnosis not present

## 2019-05-14 DIAGNOSIS — X32XXXA Exposure to sunlight, initial encounter: Secondary | ICD-10-CM | POA: Diagnosis not present

## 2019-05-14 DIAGNOSIS — D225 Melanocytic nevi of trunk: Secondary | ICD-10-CM | POA: Diagnosis not present

## 2019-05-14 DIAGNOSIS — Z85828 Personal history of other malignant neoplasm of skin: Secondary | ICD-10-CM | POA: Diagnosis not present

## 2019-05-21 ENCOUNTER — Ambulatory Visit: Payer: Self-pay | Admitting: Pulmonary Disease

## 2019-05-21 DIAGNOSIS — M7541 Impingement syndrome of right shoulder: Secondary | ICD-10-CM | POA: Diagnosis not present

## 2019-05-21 DIAGNOSIS — M24111 Other articular cartilage disorders, right shoulder: Secondary | ICD-10-CM | POA: Diagnosis not present

## 2019-05-21 DIAGNOSIS — G8918 Other acute postprocedural pain: Secondary | ICD-10-CM | POA: Diagnosis not present

## 2019-05-21 DIAGNOSIS — M75111 Incomplete rotator cuff tear or rupture of right shoulder, not specified as traumatic: Secondary | ICD-10-CM | POA: Diagnosis not present

## 2019-05-21 DIAGNOSIS — S46011A Strain of muscle(s) and tendon(s) of the rotator cuff of right shoulder, initial encounter: Secondary | ICD-10-CM | POA: Diagnosis not present

## 2019-05-21 DIAGNOSIS — M7521 Bicipital tendinitis, right shoulder: Secondary | ICD-10-CM | POA: Diagnosis not present

## 2019-05-28 DIAGNOSIS — M25511 Pain in right shoulder: Secondary | ICD-10-CM | POA: Diagnosis not present

## 2019-05-28 DIAGNOSIS — M6281 Muscle weakness (generalized): Secondary | ICD-10-CM | POA: Diagnosis not present

## 2019-05-28 DIAGNOSIS — M25611 Stiffness of right shoulder, not elsewhere classified: Secondary | ICD-10-CM | POA: Diagnosis not present

## 2019-05-29 DIAGNOSIS — M24111 Other articular cartilage disorders, right shoulder: Secondary | ICD-10-CM | POA: Diagnosis not present

## 2019-05-30 DIAGNOSIS — M25511 Pain in right shoulder: Secondary | ICD-10-CM | POA: Diagnosis not present

## 2019-05-30 DIAGNOSIS — M25611 Stiffness of right shoulder, not elsewhere classified: Secondary | ICD-10-CM | POA: Diagnosis not present

## 2019-05-30 DIAGNOSIS — M6281 Muscle weakness (generalized): Secondary | ICD-10-CM | POA: Diagnosis not present

## 2019-05-31 DIAGNOSIS — G4733 Obstructive sleep apnea (adult) (pediatric): Secondary | ICD-10-CM | POA: Diagnosis not present

## 2019-06-05 DIAGNOSIS — M6281 Muscle weakness (generalized): Secondary | ICD-10-CM | POA: Diagnosis not present

## 2019-06-05 DIAGNOSIS — M25511 Pain in right shoulder: Secondary | ICD-10-CM | POA: Diagnosis not present

## 2019-06-05 DIAGNOSIS — M25611 Stiffness of right shoulder, not elsewhere classified: Secondary | ICD-10-CM | POA: Diagnosis not present

## 2019-06-07 DIAGNOSIS — M25511 Pain in right shoulder: Secondary | ICD-10-CM | POA: Diagnosis not present

## 2019-06-07 DIAGNOSIS — M25611 Stiffness of right shoulder, not elsewhere classified: Secondary | ICD-10-CM | POA: Diagnosis not present

## 2019-06-07 DIAGNOSIS — M6281 Muscle weakness (generalized): Secondary | ICD-10-CM | POA: Diagnosis not present

## 2019-06-12 DIAGNOSIS — M25511 Pain in right shoulder: Secondary | ICD-10-CM | POA: Diagnosis not present

## 2019-06-12 DIAGNOSIS — M25611 Stiffness of right shoulder, not elsewhere classified: Secondary | ICD-10-CM | POA: Diagnosis not present

## 2019-06-12 DIAGNOSIS — M6281 Muscle weakness (generalized): Secondary | ICD-10-CM | POA: Diagnosis not present

## 2019-06-14 DIAGNOSIS — M6281 Muscle weakness (generalized): Secondary | ICD-10-CM | POA: Diagnosis not present

## 2019-06-14 DIAGNOSIS — M25611 Stiffness of right shoulder, not elsewhere classified: Secondary | ICD-10-CM | POA: Diagnosis not present

## 2019-06-14 DIAGNOSIS — M25511 Pain in right shoulder: Secondary | ICD-10-CM | POA: Diagnosis not present

## 2019-06-19 ENCOUNTER — Ambulatory Visit: Payer: Self-pay | Admitting: Pulmonary Disease

## 2019-06-19 DIAGNOSIS — M25611 Stiffness of right shoulder, not elsewhere classified: Secondary | ICD-10-CM | POA: Diagnosis not present

## 2019-06-19 DIAGNOSIS — M6281 Muscle weakness (generalized): Secondary | ICD-10-CM | POA: Diagnosis not present

## 2019-06-19 DIAGNOSIS — M25511 Pain in right shoulder: Secondary | ICD-10-CM | POA: Diagnosis not present

## 2019-06-21 DIAGNOSIS — M25511 Pain in right shoulder: Secondary | ICD-10-CM | POA: Diagnosis not present

## 2019-06-21 DIAGNOSIS — M25611 Stiffness of right shoulder, not elsewhere classified: Secondary | ICD-10-CM | POA: Diagnosis not present

## 2019-06-21 DIAGNOSIS — M6281 Muscle weakness (generalized): Secondary | ICD-10-CM | POA: Diagnosis not present

## 2019-06-22 ENCOUNTER — Encounter: Payer: Self-pay | Admitting: Pulmonary Disease

## 2019-06-22 ENCOUNTER — Other Ambulatory Visit: Payer: Self-pay

## 2019-06-22 ENCOUNTER — Ambulatory Visit (INDEPENDENT_AMBULATORY_CARE_PROVIDER_SITE_OTHER): Payer: BC Managed Care – PPO | Admitting: Pulmonary Disease

## 2019-06-22 VITALS — BP 122/70 | HR 69 | Temp 97.9°F | Ht 63.0 in | Wt 142.6 lb

## 2019-06-22 DIAGNOSIS — G4733 Obstructive sleep apnea (adult) (pediatric): Secondary | ICD-10-CM | POA: Diagnosis not present

## 2019-06-22 NOTE — Patient Instructions (Signed)
Will arrange for new Bipap machine  Follow up in 2 months

## 2019-06-22 NOTE — Progress Notes (Signed)
[facility]  Chief Complaint  Patient presents with  . Follow-up    OSA on BiPap    Constitutional:  BP 122/70 (BP Location: Left Arm, Cuff Size: Normal)   Pulse 69   Temp 97.9 F (36.6 C) (Oral)   Ht 5\' 3"  (1.6 m)   Wt 142 lb 9.6 oz (64.7 kg)   LMP 02/01/2010   SpO2 99%   BMI 25.26 kg/m   Past Medical History:  TMJ, Osteoporosis, Allergies, Anxiety  Brief Summary:  Heather Mcdonald is a 64 y.o. female with obstructive sleep apnea.  Subjective:  Her weight at visit in March 2020 was 140 lbs.  Her weight has been steady, and she is comfortable with her current weight.  She has fallen asleep w/o Bipap and her husband says she snores and will have breathing troubles.    She gets leak from her mask.  She also feels like the machine isn't delivering pressure like it did before.  She had Rt rotator cuff surgery, and should be coming out of her sling in a couple of weeks.   Physical Exam:    Appearance - well kempt   ENMT - no sinus tenderness, no oral exudate, no LAN, Mallampati 3 airway, no stridor  Respiratory - equal breath sounds bilaterally, no wheezing or rales  CV - s1s2 regular rate and rhythm, no murmurs  Ext - no clubbing, no edema, Rt arm in a sling  Skin - no rashes  Psych - normal mood and affect   Assessment/Plan:   Obstructive sleep apnea. - she is compliant with Bipap - her current machine is more than 62 yrs old, not functioning properly and not amenable to repair - will arrange for new Bipap machine with pressure setting of 9/5 cm H2O - if she is still having trouble with mask leak after getting new Bipap and change in pressure, then she would need to get mask refit   A total of  21 minutes spent addressing patient care issues on day of visit.  Follow up:   Patient Instructions  Will arrange for new Bipap machine  Follow up in 2 months   Signature:  Chesley Mires, MD [place] Pager: [phone] 06/22/2019, 3:45 PM  Flow Sheet    Sleep tests:   PSG 04/16/13 >>AHI of 23.2, SaO2 low of 87%. Unable to tolerate CPAP. BiPAP 9/5 >>AHI 0.  BiPAP 05/23/19 to 06/21/19 >> used on 30 of 30 nights with average 6 hrs 23 min.  Average AHI 5.1 with Bipap 10/7 cm H2O  Medications:   Allergies as of 06/22/2019      Reactions   Penicillins    REACTION: rash      Medication List       Accurate as of June 22, 2019  3:45 PM. If you have any questions, ask your nurse or doctor.        alendronate 70 MG tablet Commonly known as: FOSAMAX TAKE 1 TABLET EVERY 7 DAYS WITH A FULL GLASS OF WATER ON AN EMPTY STOMACH DO NOT LIE DOWN FOR AT LEAST 30 MIN   bimatoprost 0.03 % ophthalmic solution Commonly known as: LATISSE Administer 1 application into the left eye Take as directed. Place one drop on applicator and apply evenly along the skin of the eyelash at base every other night.   bimatoprost 0.03 % ophthalmic solution Commonly known as: LUMIGAN PLACE ONE DROP ON APPLICATOR AND APPLY EVENLY ALONG THE SKIN  OF THE EYELASH AT BASE. USE EVERY OTHER NIGHT.   CALCIUM/VITAMIN D PO Take 1 tablet by mouth 2 (two) times daily.   celecoxib 100 MG capsule Commonly known as: CELEBREX Take 100 mg by mouth 2 (two) times daily.   diazepam 5 MG tablet Commonly known as: VALIUM SMARTSIG:1 Tablet(s) By Mouth As Needed       Past Surgical History:  She  has a past surgical history that includes Lipoma excision; cervical cryo (1996); basal cell removal; Colonoscopy (04/08/2008); Breast cyst aspiration (Left, 9/11 and 2/12); and Cholecystectomy (03/02/2011).  Family History:  Her family history includes Anesthesia problems in her mother; Bladder Cancer in her father; Breast cancer in her maternal grandmother; Colon cancer (age of onset: 57) in her mother; Colon polyps in her mother; Diabetes in her paternal grandmother; Diabetes type II in her father; Heart disease in her maternal  grandfather and paternal grandfather; Hypertension in her father and mother; Stroke (age of onset: 52) in her paternal grandmother.  Social History:  She  reports that she has never smoked. She has never used smokeless tobacco. She reports current alcohol use. She reports that she does not use drugs.

## 2019-06-26 DIAGNOSIS — M6281 Muscle weakness (generalized): Secondary | ICD-10-CM | POA: Diagnosis not present

## 2019-06-26 DIAGNOSIS — M25511 Pain in right shoulder: Secondary | ICD-10-CM | POA: Diagnosis not present

## 2019-06-26 DIAGNOSIS — M25611 Stiffness of right shoulder, not elsewhere classified: Secondary | ICD-10-CM | POA: Diagnosis not present

## 2019-06-27 ENCOUNTER — Telehealth: Payer: Self-pay | Admitting: Pulmonary Disease

## 2019-06-27 NOTE — Telephone Encounter (Signed)
I called this number and it will not let me go through stating the call cannot currently be completed. Will try again later to verify what they need.

## 2019-06-28 DIAGNOSIS — M25511 Pain in right shoulder: Secondary | ICD-10-CM | POA: Diagnosis not present

## 2019-06-28 DIAGNOSIS — M25611 Stiffness of right shoulder, not elsewhere classified: Secondary | ICD-10-CM | POA: Diagnosis not present

## 2019-06-28 DIAGNOSIS — M6281 Muscle weakness (generalized): Secondary | ICD-10-CM | POA: Diagnosis not present

## 2019-06-28 NOTE — Telephone Encounter (Signed)
Message sent to [facility] since they have access to the records

## 2019-07-03 DIAGNOSIS — M25511 Pain in right shoulder: Secondary | ICD-10-CM | POA: Diagnosis not present

## 2019-07-03 DIAGNOSIS — M6281 Muscle weakness (generalized): Secondary | ICD-10-CM | POA: Diagnosis not present

## 2019-07-03 DIAGNOSIS — M25611 Stiffness of right shoulder, not elsewhere classified: Secondary | ICD-10-CM | POA: Diagnosis not present

## 2019-07-05 DIAGNOSIS — M6281 Muscle weakness (generalized): Secondary | ICD-10-CM | POA: Diagnosis not present

## 2019-07-05 DIAGNOSIS — M25611 Stiffness of right shoulder, not elsewhere classified: Secondary | ICD-10-CM | POA: Diagnosis not present

## 2019-07-05 DIAGNOSIS — M25511 Pain in right shoulder: Secondary | ICD-10-CM | POA: Diagnosis not present

## 2019-07-10 DIAGNOSIS — M25511 Pain in right shoulder: Secondary | ICD-10-CM | POA: Diagnosis not present

## 2019-07-10 DIAGNOSIS — M25611 Stiffness of right shoulder, not elsewhere classified: Secondary | ICD-10-CM | POA: Diagnosis not present

## 2019-07-10 DIAGNOSIS — M6281 Muscle weakness (generalized): Secondary | ICD-10-CM | POA: Diagnosis not present

## 2019-07-12 DIAGNOSIS — M25511 Pain in right shoulder: Secondary | ICD-10-CM | POA: Diagnosis not present

## 2019-07-12 DIAGNOSIS — M6281 Muscle weakness (generalized): Secondary | ICD-10-CM | POA: Diagnosis not present

## 2019-07-12 DIAGNOSIS — M25611 Stiffness of right shoulder, not elsewhere classified: Secondary | ICD-10-CM | POA: Diagnosis not present

## 2019-07-12 DIAGNOSIS — G4733 Obstructive sleep apnea (adult) (pediatric): Secondary | ICD-10-CM | POA: Diagnosis not present

## 2019-07-16 ENCOUNTER — Telehealth: Payer: Self-pay

## 2019-07-16 DIAGNOSIS — G4733 Obstructive sleep apnea (adult) (pediatric): Secondary | ICD-10-CM | POA: Diagnosis not present

## 2019-07-16 NOTE — Telephone Encounter (Signed)
Called the patient to schedule a follow up for new cpap start. Needs to be scheduled between 08/12/19-10/10/19. I was unable to reach the patient LVM. Will follow up.

## 2019-07-17 DIAGNOSIS — M6281 Muscle weakness (generalized): Secondary | ICD-10-CM | POA: Diagnosis not present

## 2019-07-17 DIAGNOSIS — M25611 Stiffness of right shoulder, not elsewhere classified: Secondary | ICD-10-CM | POA: Diagnosis not present

## 2019-07-17 DIAGNOSIS — M25511 Pain in right shoulder: Secondary | ICD-10-CM | POA: Diagnosis not present

## 2019-07-19 DIAGNOSIS — M6281 Muscle weakness (generalized): Secondary | ICD-10-CM | POA: Diagnosis not present

## 2019-07-19 DIAGNOSIS — M25511 Pain in right shoulder: Secondary | ICD-10-CM | POA: Diagnosis not present

## 2019-07-19 DIAGNOSIS — M25611 Stiffness of right shoulder, not elsewhere classified: Secondary | ICD-10-CM | POA: Diagnosis not present

## 2019-07-24 DIAGNOSIS — M25511 Pain in right shoulder: Secondary | ICD-10-CM | POA: Diagnosis not present

## 2019-07-24 DIAGNOSIS — M6281 Muscle weakness (generalized): Secondary | ICD-10-CM | POA: Diagnosis not present

## 2019-07-24 DIAGNOSIS — M25611 Stiffness of right shoulder, not elsewhere classified: Secondary | ICD-10-CM | POA: Diagnosis not present

## 2019-07-31 DIAGNOSIS — M25511 Pain in right shoulder: Secondary | ICD-10-CM | POA: Diagnosis not present

## 2019-07-31 DIAGNOSIS — M6281 Muscle weakness (generalized): Secondary | ICD-10-CM | POA: Diagnosis not present

## 2019-07-31 DIAGNOSIS — M25611 Stiffness of right shoulder, not elsewhere classified: Secondary | ICD-10-CM | POA: Diagnosis not present

## 2019-08-07 DIAGNOSIS — M25511 Pain in right shoulder: Secondary | ICD-10-CM | POA: Diagnosis not present

## 2019-08-07 DIAGNOSIS — M25611 Stiffness of right shoulder, not elsewhere classified: Secondary | ICD-10-CM | POA: Diagnosis not present

## 2019-08-07 DIAGNOSIS — M6281 Muscle weakness (generalized): Secondary | ICD-10-CM | POA: Diagnosis not present

## 2019-08-11 DIAGNOSIS — G4733 Obstructive sleep apnea (adult) (pediatric): Secondary | ICD-10-CM | POA: Diagnosis not present

## 2019-08-16 DIAGNOSIS — M25611 Stiffness of right shoulder, not elsewhere classified: Secondary | ICD-10-CM | POA: Diagnosis not present

## 2019-08-16 DIAGNOSIS — M6281 Muscle weakness (generalized): Secondary | ICD-10-CM | POA: Diagnosis not present

## 2019-08-16 DIAGNOSIS — M25511 Pain in right shoulder: Secondary | ICD-10-CM | POA: Diagnosis not present

## 2019-08-21 DIAGNOSIS — M25511 Pain in right shoulder: Secondary | ICD-10-CM | POA: Diagnosis not present

## 2019-08-21 DIAGNOSIS — M6281 Muscle weakness (generalized): Secondary | ICD-10-CM | POA: Diagnosis not present

## 2019-08-21 DIAGNOSIS — M25611 Stiffness of right shoulder, not elsewhere classified: Secondary | ICD-10-CM | POA: Diagnosis not present

## 2019-08-28 DIAGNOSIS — M25611 Stiffness of right shoulder, not elsewhere classified: Secondary | ICD-10-CM | POA: Diagnosis not present

## 2019-08-28 DIAGNOSIS — M6281 Muscle weakness (generalized): Secondary | ICD-10-CM | POA: Diagnosis not present

## 2019-08-28 DIAGNOSIS — M25511 Pain in right shoulder: Secondary | ICD-10-CM | POA: Diagnosis not present

## 2019-09-11 DIAGNOSIS — M25511 Pain in right shoulder: Secondary | ICD-10-CM | POA: Diagnosis not present

## 2019-09-11 DIAGNOSIS — G4733 Obstructive sleep apnea (adult) (pediatric): Secondary | ICD-10-CM | POA: Diagnosis not present

## 2019-09-11 DIAGNOSIS — M25611 Stiffness of right shoulder, not elsewhere classified: Secondary | ICD-10-CM | POA: Diagnosis not present

## 2019-09-11 DIAGNOSIS — M6281 Muscle weakness (generalized): Secondary | ICD-10-CM | POA: Diagnosis not present

## 2019-09-14 ENCOUNTER — Encounter: Payer: Self-pay | Admitting: Pulmonary Disease

## 2019-09-14 ENCOUNTER — Ambulatory Visit (INDEPENDENT_AMBULATORY_CARE_PROVIDER_SITE_OTHER): Payer: BC Managed Care – PPO | Admitting: Pulmonary Disease

## 2019-09-14 ENCOUNTER — Other Ambulatory Visit: Payer: Self-pay

## 2019-09-14 VITALS — BP 138/72 | HR 70 | Temp 97.4°F | Ht 65.0 in | Wt 143.2 lb

## 2019-09-14 DIAGNOSIS — G4733 Obstructive sleep apnea (adult) (pediatric): Secondary | ICD-10-CM

## 2019-09-14 NOTE — Patient Instructions (Signed)
Follow up in 1 year.

## 2019-09-14 NOTE — Progress Notes (Signed)
[facility]  Chief Complaint  Patient presents with  . Follow-up    sleep apnea    Constitutional:  BP 138/72 (BP Location: Left Arm, Cuff Size: Normal)   Pulse 70   Temp (!) 97.4 F (36.3 C) (Other (Comment)) Comment (Src): wrist  Ht 5\' 5"  (1.651 m)   Wt 143 lb 3.2 oz (65 kg)   LMP 02/01/2010   SpO2 100% Comment: room air  BMI 23.83 kg/m   Past Medical History:  TMJ, Osteoporosis, Allergies, Anxiety  Past Surgical History:  Her  has a past surgical history that includes Lipoma excision; cervical cryo (1996); basal cell removal; Colonoscopy (04/08/2008); Breast cyst aspiration (Left, 9/11 and 2/12); and Cholecystectomy (03/02/2011).  Brief Summary:  Heather Mcdonald is a 64 y.o. female with obstructive sleep apnea.      Subjective:  She got her new Bipap machine.  Has new memory foam mask, and this fits better.  No issues with pressure.  Denies sinus congestion, sore throat, dry mouth, or aerophagia.  She has been driving between [place] to tend to her mother.  Her mother is back in hospital after fracturing her femur.  Physical Exam:   Appearance - well kempt   ENMT - no sinus tenderness, no oral exudate, no LAN, Mallampati 3 airway, no stridor  Respiratory - equal breath sounds bilaterally, no wheezing or rales  CV - s1s2 regular rate and rhythm, no murmurs  Ext - no clubbing, no edema  Skin - no rashes  Psych - normal mood and affect   Sleep Tests:   PSG 04/16/13 >>AHI of 23.2, SaO2 low of 87%. Unable to tolerate CPAP. BiPAP 9/5 >>AHI 0.  BiPAP7/27/21 to 09/12/19 >> used on 28 of 30 nights with average 7 hrs 31 min.  Average AHI 2.3 with Bipap 9/5 cm H2O.  Social History:  She  reports that she has never smoked. She has never used smokeless tobacco. She reports current alcohol use. She reports that she does not use drugs.  Family History:  Her family history includes Anesthesia problems in  her mother; Bladder Cancer in her father; Breast cancer in her maternal grandmother; Colon cancer (age of onset: 33) in her mother; Colon polyps in her mother; Diabetes in her paternal grandmother; Diabetes type II in her father; Heart disease in her maternal grandfather and paternal grandfather; Hypertension in her father and mother; Stroke (age of onset: 49) in her paternal grandmother.     Assessment/Plan:   Obstructive sleep apnea. - she is compliant with Bipap and reports benefit - she uses [employer] for her DME - continue Bipap 9/5 cm H2O  Time Spent Involved in Patient Care on Day of Examination:  14 minutes  Follow up:  Patient Instructions  Follow up in 1 year   Medication List:   Allergies as of 09/14/2019      Reactions   Penicillins    REACTION: rash      Medication List       Accurate as of September 14, 2019 11:16 AM. If you have any questions, ask your nurse or doctor.        STOP taking these medications   celecoxib 100 MG capsule Commonly known as: CELEBREX Stopped by: Chesley Mires, MD   diazepam 5 MG tablet Commonly known as: VALIUM Stopped by: Chesley Mires, MD     TAKE these medications   alendronate 70 MG tablet Commonly known as: FOSAMAX TAKE 1  TABLET EVERY 7 DAYS WITH A FULL GLASS OF WATER ON AN EMPTY STOMACH DO NOT LIE DOWN FOR AT LEAST 30 MIN   bimatoprost 0.03 % ophthalmic solution Commonly known as: LATISSE Administer 1 application into the left eye Take as directed. Place one drop on applicator and apply evenly along the skin of the eyelash at base every other night.   bimatoprost 0.03 % ophthalmic solution Commonly known as: LUMIGAN PLACE ONE DROP ON APPLICATOR AND APPLY EVENLY ALONG THE SKIN OF THE EYELASH AT BASE. USE EVERY OTHER NIGHT.   CALCIUM/VITAMIN D PO Take 1 tablet by mouth 2 (two) times daily.       Signature:  Chesley Mires, MD [place] Pager - [phone] 09/14/2019, 11:16 AM

## 2019-09-25 DIAGNOSIS — M25511 Pain in right shoulder: Secondary | ICD-10-CM | POA: Diagnosis not present

## 2019-10-12 DIAGNOSIS — G4733 Obstructive sleep apnea (adult) (pediatric): Secondary | ICD-10-CM | POA: Diagnosis not present

## 2019-10-18 ENCOUNTER — Other Ambulatory Visit: Payer: Self-pay | Admitting: Family Medicine

## 2019-10-18 DIAGNOSIS — G4733 Obstructive sleep apnea (adult) (pediatric): Secondary | ICD-10-CM | POA: Diagnosis not present

## 2019-10-18 NOTE — Telephone Encounter (Signed)
Pt hasn't been seen in over a year and no future appts., please advise  

## 2019-10-18 NOTE — Telephone Encounter (Signed)
Please schedule PE or f/u and refill until then 

## 2019-10-19 NOTE — Telephone Encounter (Signed)
Med refilled once and Carrie will reach out to pt to try and get appt scheduled  

## 2019-11-14 ENCOUNTER — Ambulatory Visit (INDEPENDENT_AMBULATORY_CARE_PROVIDER_SITE_OTHER): Payer: BC Managed Care – PPO | Admitting: Family Medicine

## 2019-11-14 ENCOUNTER — Encounter: Payer: Self-pay | Admitting: Family Medicine

## 2019-11-14 ENCOUNTER — Other Ambulatory Visit: Payer: Self-pay

## 2019-11-14 VITALS — BP 122/74 | HR 74 | Temp 96.9°F | Ht 65.0 in | Wt 143.4 lb

## 2019-11-14 DIAGNOSIS — R7989 Other specified abnormal findings of blood chemistry: Secondary | ICD-10-CM | POA: Insufficient documentation

## 2019-11-14 DIAGNOSIS — Z0000 Encounter for general adult medical examination without abnormal findings: Secondary | ICD-10-CM

## 2019-11-14 DIAGNOSIS — M810 Age-related osteoporosis without current pathological fracture: Secondary | ICD-10-CM

## 2019-11-14 LAB — VITAMIN D 25 HYDROXY (VIT D DEFICIENCY, FRACTURES): VITD: 45.19 ng/mL (ref 30.00–100.00)

## 2019-11-14 MED ORDER — ALENDRONATE SODIUM 70 MG PO TABS: ORAL_TABLET | ORAL | 3 refills | Status: DC

## 2019-11-14 NOTE — Assessment & Plan Note (Signed)
Dexa12/20  No falls or fractures  Taking alendronate for 2 years  Taking ca and D and walking

## 2019-11-14 NOTE — Assessment & Plan Note (Signed)
D level today  Pt has osteoporosis

## 2019-11-14 NOTE — Patient Instructions (Addendum)
You are due for a mammogram after December 9  Vitamin D level today  Keep walking  Take care of yourself   Labs look good from work

## 2019-11-14 NOTE — Assessment & Plan Note (Signed)
Reviewed health habits including diet and exercise and skin cancer prevention Reviewed appropriate screening tests for age  Also reviewed health mt list, fam hx and immunization status , as well as social and family history   See HPI Labs from her work reviewed  Vit D test ordered  Continues derm care after basal cell on eyelid  pap utd  Mammogram due in December Immunized for covid  Also has shingrix vaccine  Continues alendronate for bone density  No falls or fx and taking ca plus D

## 2019-11-14 NOTE — Progress Notes (Signed)
Subjective:    Patient ID: Heather Mcdonald, female    DOB: 10-16-1955, 64 y.o.   MRN: [id number]  This visit occurred during the SARS-CoV-2 public health emergency.  Safety protocols were in place, including screening questions prior to the visit, additional usage of staff PPE, and extensive cleaning of exam room while observing appropriate contact time as indicated for disinfecting solutions.    HPI Here for health maintenance exam and to review chronic medical problems    Wt Readings from Last 3 Encounters:  11/14/19 143 lb 6 oz (65 kg)  09/14/19 143 lb 3.2 oz (65 kg)  06/22/19 142 lb 9.6 oz (64.7 kg)   23.86 kg/m  Getting exercise   -walking every day and wants to get back to yoga   Very busy  Had eye surgery (moh's for bcc) -Dr Neldon Labella did a reconstruction   (went well, a little dry eye)   and rotator cuff surgery   (tore rotator cuff doing yoga)   Goes to derm for f/u  Still able to wear contact lenses   Working from home some   Sold a house , and building a house at [employer]   Pap 2/18 -neg with neg HPV Long ago had abnormal pap and did cryo tx (years ago-early 75s) No new partners  No gyn symptoms    Mammogram 12/20 (had to repeat with [place]- had a cyst) MGM with h/o breast cancer Self breast exam -no lumps   Colonoscopy 3/20 small polyp- 50 y recall  Mother had colon cancer at 72  Tdap 1/14 covid status -immunized  Flu shot 10/21  Zoster status -had shingrix vaccine   Osteoporosis --had 12/27/18  Taking alendronate weekly (2 years) -tolerating it well  dexa -12/20 Falls- none  Fractures -none Supplements-ca and D   (per pt she had vit D checked in the past-it was low) Exercise -walking   Most recent lab visit from work  A1C 5.4  Tot chol 173 LDL 87 Trig 91 HDL 68  Cr 1.04  GFR 59  She has had slight elevation in the past    TSH 1.4   Nl chemistries incl liver tests   Glucose 90  Patient Active Problem List   Diagnosis Date Noted    . Low vitamin D level 11/14/2019  . Screening mammogram, encounter for 10/02/2018  . Family history of colon cancer mother at 103 03/01/2016  . Estrogen deficiency 03/01/2016  . Osteoporosis 03/01/2016  . Aerophagia 07/27/2013  . OSA (obstructive sleep apnea) 02/19/2013  . Routine general medical examination at a health care facility 02/18/2012  . Encounter for routine gynecological examination 02/18/2012  . ALLERGIC RHINITIS 12/22/2006  . FIBROCYSTIC BREAST DISEASE 12/22/2006   Past Medical History:  Diagnosis Date  . Allergic rhinitis   . Allergy   . Anxiety disorder   . Basal cell carcinoma   . Bronchitis 24+yrs ago  . Fibrocystic breast disease   . Foot fracture   . Gallbladder dyskinesia 01/28/2011  . Gestational diabetes   . History of uterine fibroid   . Joint pain   . OSA (obstructive sleep apnea) 02/19/2013  . Osteoporosis   . Papanicolaou smear of cervix with atypical squamous cells of undetermined significance (ASC-US)   . RUQ pain   . Sleep apnea    On CPAP  . Temporomandibular joint disorder (TMJ)    Past Surgical History:  Procedure Laterality Date  . basal cell removal     from face  .  BREAST CYST ASPIRATION Left 9/11 and 2/12   benign  . cervical cryo  1996  . CHOLECYSTECTOMY  03/02/2011   Procedure: LAPAROSCOPIC CHOLECYSTECTOMY;  Surgeon: Haywood Lasso, MD;  Location: [facility];  Service: General;  Laterality: N/A;  attempted intraoperative cholangiogram  . COLONOSCOPY  04/08/2008   normal  . LIPOMA EXCISION     from back   Social History   Tobacco Use  . Smoking status: Never Smoker  . Smokeless tobacco: Never Used  Vaping Use  . Vaping Use: Never used  Substance Use Topics  . Alcohol use: Yes    Alcohol/week: 0.0 standard drinks    Comment: socially-couple of times a week  . Drug use: No   Family History  Problem Relation Age of Onset  . Diabetes type II Father   . Hypertension Father   . Bladder Cancer Father   . Hypertension Mother    . Colon polyps Mother   . Anesthesia problems Mother   . Colon cancer Mother 40  . Breast cancer Maternal Grandmother        ? female cancer  . Heart disease Maternal Grandfather   . Diabetes Paternal Grandmother   . Stroke Paternal Grandmother 45  . Heart disease Paternal Grandfather   . Hypotension Neg Hx   . Malignant hyperthermia Neg Hx   . Pseudochol deficiency Neg Hx   . Esophageal cancer Neg Hx   . Rectal cancer Neg Hx   . Stomach cancer Neg Hx    Allergies  Allergen Reactions  . Penicillins     REACTION: rash   Current Outpatient Medications on File Prior to Visit  Medication Sig Dispense Refill  . Calcium Carb-Cholecalciferol (CALCIUM/VITAMIN D PO) Take 1 tablet by mouth 2 (two) times daily.    . Multiple Vitamins-Minerals (ICAPS AREDS 2 PO) Take 1 capsule by mouth in the morning and at bedtime.     No current facility-administered medications on file prior to visit.    Review of Systems  Constitutional: Negative for activity change, appetite change, fatigue, fever and unexpected weight change.  HENT: Negative for congestion, ear pain, rhinorrhea, sinus pressure and sore throat.   Eyes: Negative for pain, redness and visual disturbance.  Respiratory: Negative for cough, shortness of breath and wheezing.   Cardiovascular: Negative for chest pain and palpitations.  Gastrointestinal: Negative for abdominal pain, blood in stool, constipation and diarrhea.  Endocrine: Negative for polydipsia and polyuria.  Genitourinary: Negative for dysuria, frequency and urgency.  Musculoskeletal: Negative for arthralgias, back pain and myalgias.  Skin: Negative for pallor and rash.  Allergic/Immunologic: Negative for environmental allergies.  Neurological: Negative for dizziness, syncope and headaches.  Hematological: Negative for adenopathy. Does not bruise/bleed easily.  Psychiatric/Behavioral: Negative for decreased concentration and dysphoric mood. The patient is not  nervous/anxious.        Objective:   Physical Exam Constitutional:      General: She is not in acute distress.    Appearance: Normal appearance. She is well-developed and normal weight. She is not ill-appearing or diaphoretic.  HENT:     Head: Normocephalic and atraumatic.     Right Ear: Tympanic membrane, ear canal and external ear normal.     Left Ear: Tympanic membrane, ear canal and external ear normal.     Nose: Nose normal. No congestion.     Mouth/Throat:     Mouth: Mucous membranes are moist.     Pharynx: Oropharynx is clear. No posterior oropharyngeal erythema.  Eyes:  General: No scleral icterus.    Extraocular Movements: Extraocular movements intact.     Conjunctiva/sclera: Conjunctivae normal.     Pupils: Pupils are equal, round, and reactive to light.  Neck:     Thyroid: No thyromegaly.     Vascular: No carotid bruit or JVD.  Cardiovascular:     Rate and Rhythm: Normal rate and regular rhythm.     Pulses: Normal pulses.     Heart sounds: Normal heart sounds. No gallop.   Pulmonary:     Effort: Pulmonary effort is normal. No respiratory distress.     Breath sounds: Normal breath sounds. No wheezing.     Comments: Good air exch Chest:     Chest wall: No tenderness.  Abdominal:     General: Bowel sounds are normal. There is no distension or abdominal bruit.     Palpations: Abdomen is soft. There is no mass.     Tenderness: There is no abdominal tenderness.     Hernia: No hernia is present.  Genitourinary:    Comments: Breast exam: No mass, nodules, thickening, tenderness, bulging, retraction, inflamation, nipple discharge or skin changes noted.  No axillary or clavicular LA.     Musculoskeletal:        General: No tenderness. Normal range of motion.     Cervical back: Normal range of motion and neck supple. No rigidity. No muscular tenderness.     Right lower leg: No edema.     Left lower leg: No edema.     Comments: No kyphosis   Lymphadenopathy:      Cervical: No cervical adenopathy.  Skin:    General: Skin is warm and dry.     Coloration: Skin is not pale.     Findings: No erythema or rash.     Comments: Solar lentigines diffusely    Neurological:     Mental Status: She is alert. Mental status is at baseline.     Cranial Nerves: No cranial nerve deficit.     Motor: No abnormal muscle tone.     Coordination: Coordination normal.     Gait: Gait normal.     Deep Tendon Reflexes: Reflexes are normal and symmetric. Reflexes normal.  Psychiatric:        Mood and Affect: Mood normal.        Cognition and Memory: Cognition and memory normal.           Assessment & Plan:   Problem List Items Addressed This Visit      Musculoskeletal and Integument   Osteoporosis    Dexa12/20  No falls or fractures  Taking alendronate for 2 years  Taking ca and D and walking       Relevant Medications   alendronate (FOSAMAX) 70 MG tablet     Other   Routine general medical examination at a health care facility - Primary    Reviewed health habits including diet and exercise and skin cancer prevention Reviewed appropriate screening tests for age  Also reviewed health mt list, fam hx and immunization status , as well as social and family history   See HPI Labs from her work reviewed  Vit D test ordered  Continues derm care after basal cell on eyelid  pap utd  Mammogram due in December Immunized for covid  Also has shingrix vaccine  Continues alendronate for bone density  No falls or fx and taking ca plus D          Low vitamin D  level    D level today  Pt has osteoporosis      Relevant Orders   VITAMIN D 25 Hydroxy (Vit-D Deficiency, Fractures) (Completed)

## 2019-11-15 ENCOUNTER — Encounter: Payer: Self-pay | Admitting: *Deleted

## 2019-12-27 ENCOUNTER — Other Ambulatory Visit: Payer: Self-pay | Admitting: Family Medicine

## 2019-12-27 DIAGNOSIS — X32XXXA Exposure to sunlight, initial encounter: Secondary | ICD-10-CM | POA: Diagnosis not present

## 2019-12-27 DIAGNOSIS — D225 Melanocytic nevi of trunk: Secondary | ICD-10-CM | POA: Diagnosis not present

## 2019-12-27 DIAGNOSIS — Z85828 Personal history of other malignant neoplasm of skin: Secondary | ICD-10-CM | POA: Diagnosis not present

## 2019-12-27 DIAGNOSIS — Z1231 Encounter for screening mammogram for malignant neoplasm of breast: Secondary | ICD-10-CM

## 2019-12-27 DIAGNOSIS — L111 Transient acantholytic dermatosis [Grover]: Secondary | ICD-10-CM | POA: Diagnosis not present

## 2019-12-27 DIAGNOSIS — L720 Epidermal cyst: Secondary | ICD-10-CM | POA: Diagnosis not present

## 2019-12-27 DIAGNOSIS — L570 Actinic keratosis: Secondary | ICD-10-CM | POA: Diagnosis not present

## 2020-01-08 ENCOUNTER — Ambulatory Visit
Admission: RE | Admit: 2020-01-08 | Discharge: 2020-01-08 | Disposition: A | Discharge status: Home / Self Care | Payer: BC Managed Care – PPO | Source: Ambulatory Visit | Attending: Family Medicine | Admitting: Family Medicine

## 2020-01-08 ENCOUNTER — Other Ambulatory Visit: Payer: Self-pay

## 2020-01-08 DIAGNOSIS — Z1231 Encounter for screening mammogram for malignant neoplasm of breast: Secondary | ICD-10-CM

## 2020-01-10 DIAGNOSIS — Z1231 Encounter for screening mammogram for malignant neoplasm of breast: Secondary | ICD-10-CM

## 2020-01-16 DIAGNOSIS — G4733 Obstructive sleep apnea (adult) (pediatric): Secondary | ICD-10-CM | POA: Diagnosis not present

## 2020-01-31 ENCOUNTER — Encounter: Payer: Self-pay | Admitting: Family Medicine

## 2020-04-08 IMAGING — MG DIGITAL SCREENING BILATERAL MAMMOGRAM WITH TOMO AND CAD
8 series · 9 of 24 positions shown · non-contrast
Comparison: Previous exam(s).

CLINICAL DATA: Screening.

EXAM:
DIGITAL SCREENING BILATERAL MAMMOGRAM WITH TOMO AND CAD

[L MLO synth-2D]
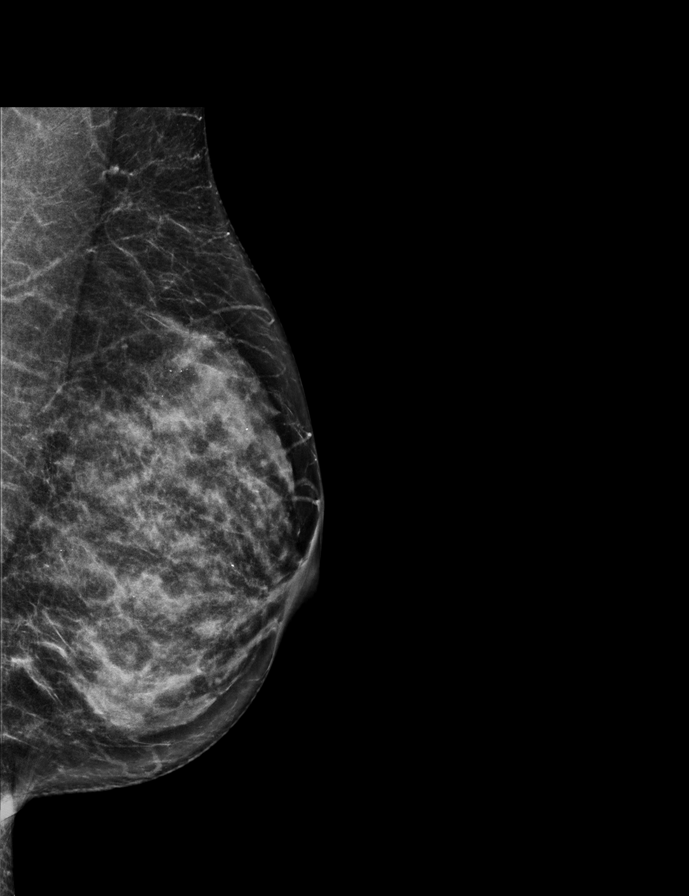

[R CC synth-2D]
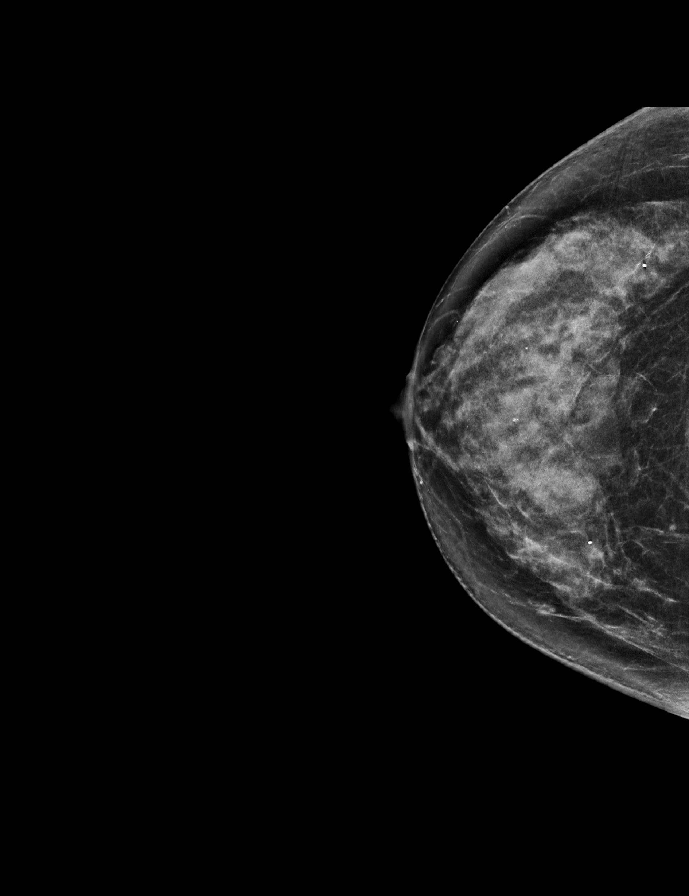

[R MLO synth-2D]
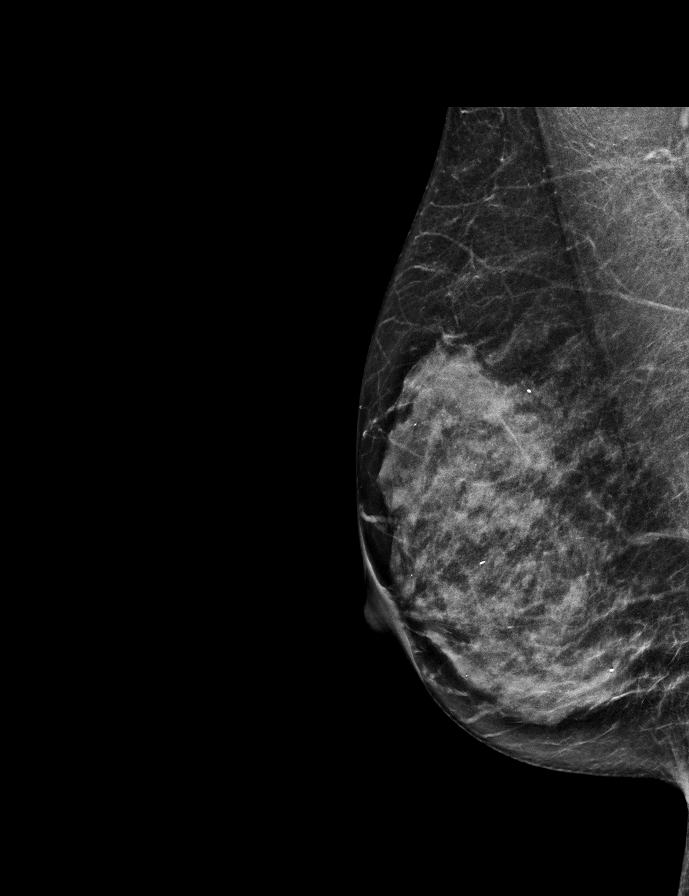

[L CC synth-2D]
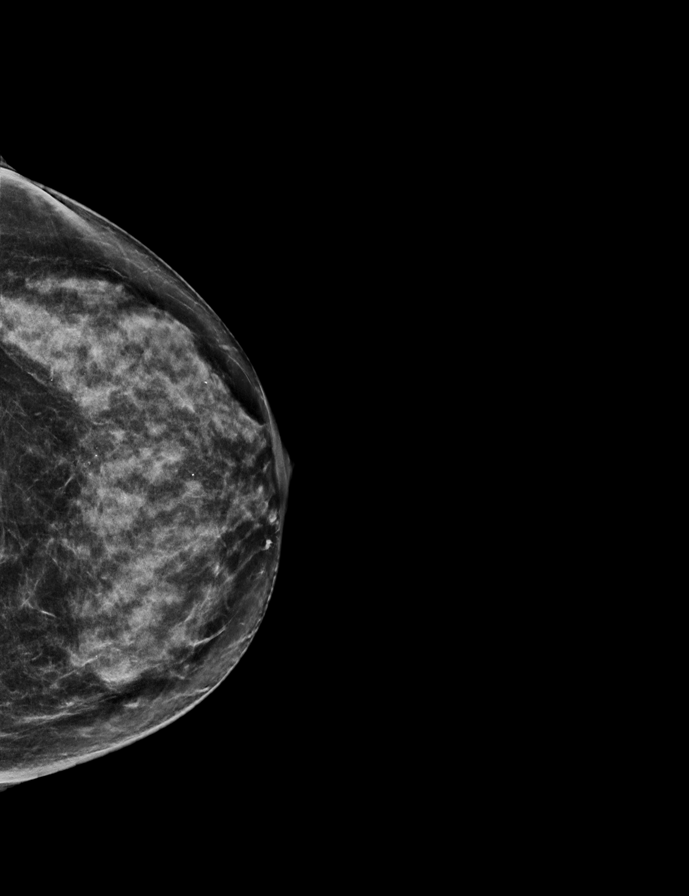

[R CC tomo · 2 of 65 frames shown]
[frame 21/65]
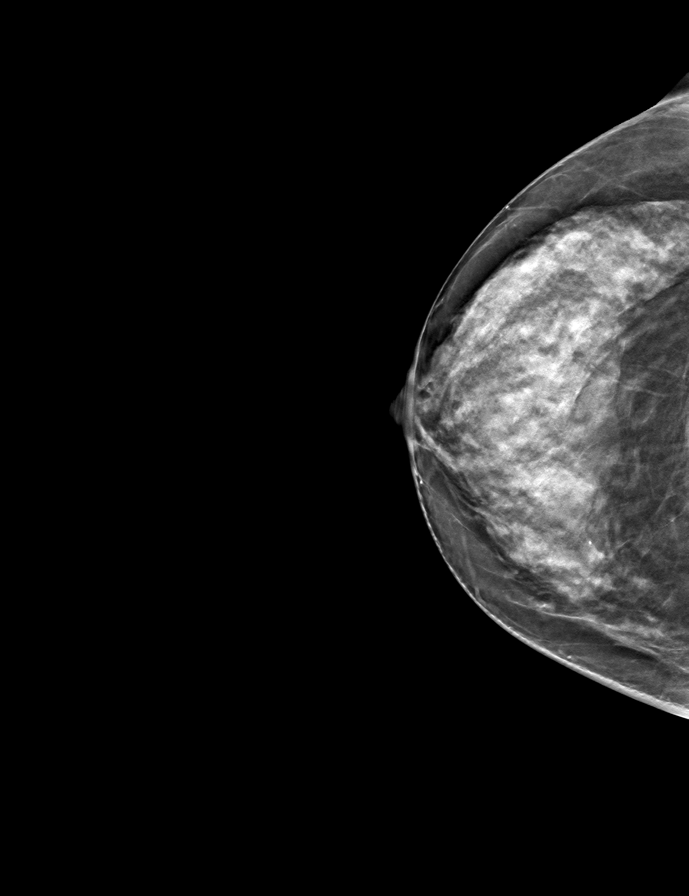
[frame 33/65]
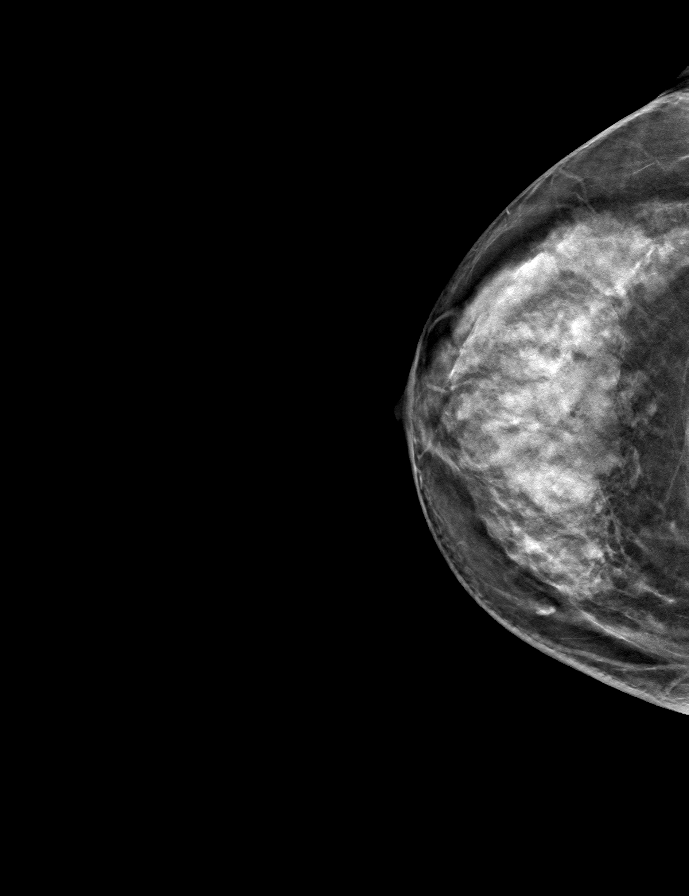

[L MLO tomo · tomo slice 31/61.0]
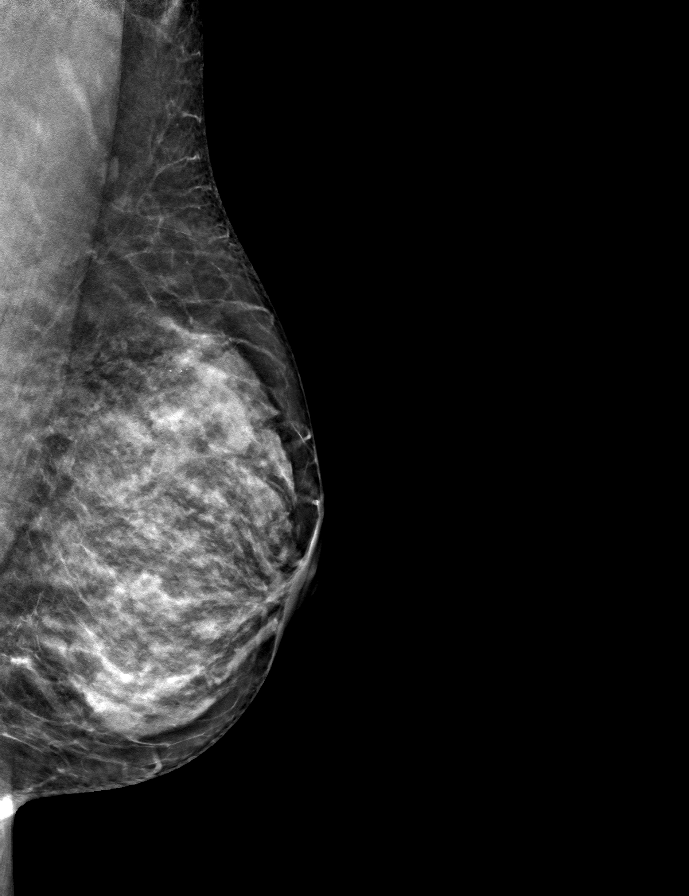

[R MLO tomo · tomo slice 29/58.0]
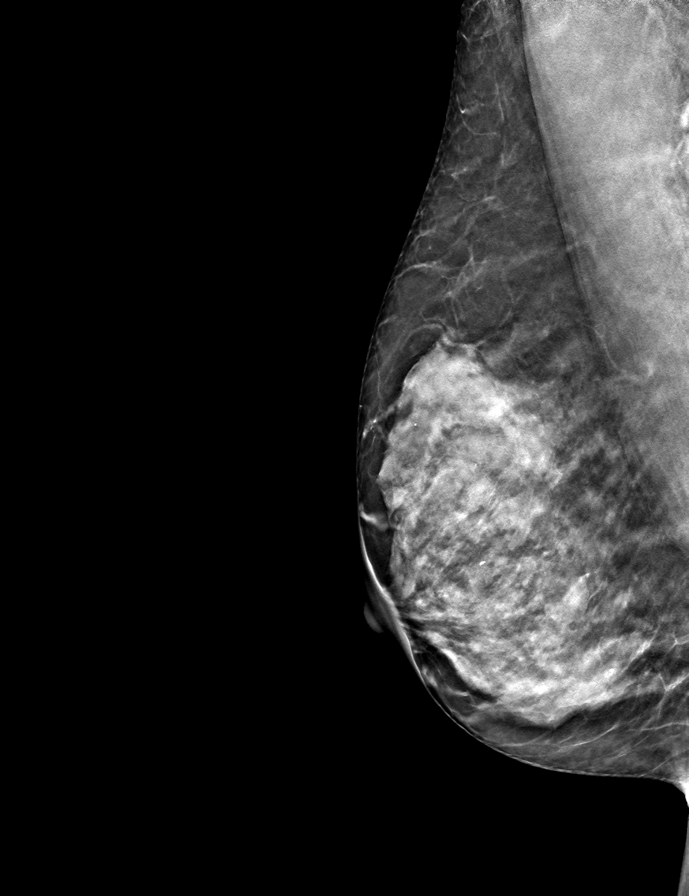

[L CC tomo · tomo slice 31/61.0]
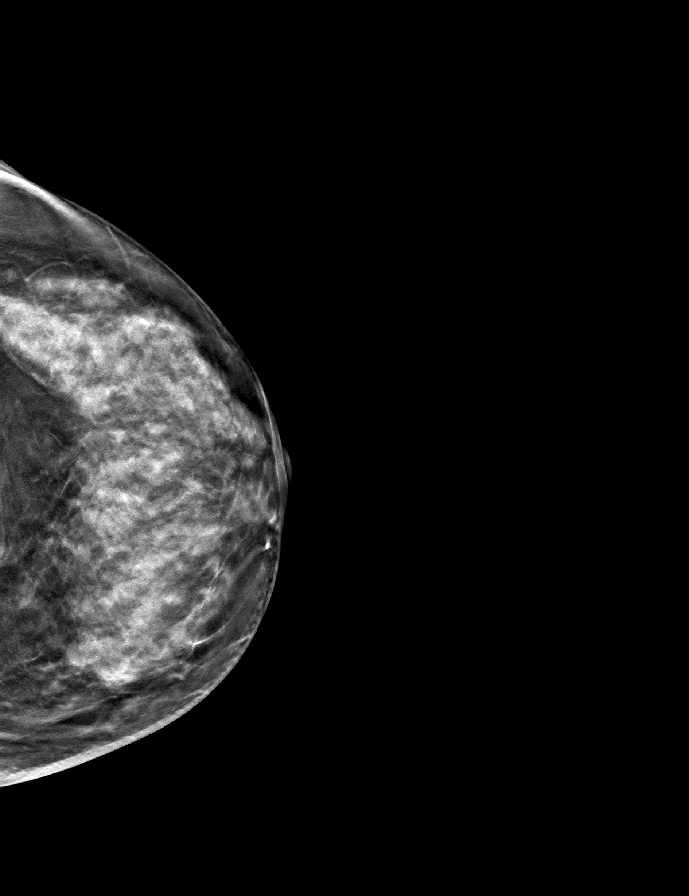

[9 of 24 positions shown; findings below may reference images not displayed]

ACR Breast Density Category c: The breast tissue is heterogeneously
dense, which may obscure small masses.
FINDINGS: There are no findings suspicious for malignancy. Images were
processed with CAD.
IMPRESSION: No mammographic evidence of malignancy. A result letter of this
screening mammogram will be mailed directly to the patient.

RECOMMENDATION:
Screening mammogram in one year. (Code:[id number]-[id number])

BI-RADS CATEGORY  1: Negative.

## 2020-04-25 ENCOUNTER — Encounter: Payer: Self-pay | Admitting: Family Medicine

## 2020-10-07 ENCOUNTER — Other Ambulatory Visit: Payer: Self-pay | Admitting: Family Medicine

## 2020-11-13 LAB — HEPATIC FUNCTION PANEL
ALT: 14 (ref 7–35)
AST: 14 (ref 13–35)
Alkaline Phosphatase: 53 (ref 25–125)
Bilirubin, Total: 0.5

## 2020-11-13 LAB — COMPREHENSIVE METABOLIC PANEL
Albumin: 4.5 (ref 3.5–5.0)
Calcium: 9.2 (ref 8.7–10.7)
GFR calc non Af Amer: 59
Globulin: 2.1

## 2020-11-13 LAB — HEMOGLOBIN A1C: Hemoglobin A1C: 5.4

## 2020-11-13 LAB — BASIC METABOLIC PANEL
BUN: 13 (ref 4–21)
CO2: 25 — AB (ref 13–22)
Chloride: 99 (ref 99–108)
Creatinine: 1.1 (ref 0.5–1.1)
Glucose: 95
Potassium: 4 (ref 3.4–5.3)
Sodium: 139 (ref 137–147)

## 2020-11-13 LAB — TSH: TSH: 1.51 (ref 0.41–5.90)

## 2020-11-14 LAB — LIPID PANEL
Cholesterol: 180 (ref 0–200)
HDL: 63 (ref 35–70)
LDL Cholesterol: 99
Triglycerides: 98 (ref 40–160)

## 2020-12-05 ENCOUNTER — Other Ambulatory Visit: Payer: Self-pay | Admitting: Family Medicine

## 2020-12-05 MED ORDER — ALENDRONATE SODIUM 70 MG PO TABS: ORAL_TABLET | ORAL | 0 refills | Status: DC

## 2020-12-05 NOTE — Telephone Encounter (Signed)
Pt scheduled a lab/cpe on 12.5.22

## 2020-12-05 NOTE — Telephone Encounter (Signed)
Pt is overdue for CPE hasn't been seen in over a year. Please schedule CPE and then route back to me to refill med once appt has been scheduled

## 2020-12-05 NOTE — Telephone Encounter (Signed)
Med refilled.

## 2020-12-22 ENCOUNTER — Encounter: Payer: BC Managed Care – PPO | Admitting: Family Medicine

## 2020-12-24 DIAGNOSIS — G4733 Obstructive sleep apnea (adult) (pediatric): Secondary | ICD-10-CM | POA: Diagnosis not present

## 2020-12-29 DIAGNOSIS — D234 Other benign neoplasm of skin of scalp and neck: Secondary | ICD-10-CM | POA: Diagnosis not present

## 2020-12-29 DIAGNOSIS — D485 Neoplasm of uncertain behavior of skin: Secondary | ICD-10-CM | POA: Diagnosis not present

## 2020-12-29 DIAGNOSIS — D2261 Melanocytic nevi of right upper limb, including shoulder: Secondary | ICD-10-CM | POA: Diagnosis not present

## 2020-12-29 DIAGNOSIS — D2262 Melanocytic nevi of left upper limb, including shoulder: Secondary | ICD-10-CM | POA: Diagnosis not present

## 2020-12-29 DIAGNOSIS — Z85828 Personal history of other malignant neoplasm of skin: Secondary | ICD-10-CM | POA: Diagnosis not present

## 2020-12-29 DIAGNOSIS — X32XXXA Exposure to sunlight, initial encounter: Secondary | ICD-10-CM | POA: Diagnosis not present

## 2020-12-29 DIAGNOSIS — D2272 Melanocytic nevi of left lower limb, including hip: Secondary | ICD-10-CM | POA: Diagnosis not present

## 2020-12-29 DIAGNOSIS — C44319 Basal cell carcinoma of skin of other parts of face: Secondary | ICD-10-CM | POA: Diagnosis not present

## 2020-12-29 DIAGNOSIS — L570 Actinic keratosis: Secondary | ICD-10-CM | POA: Diagnosis not present

## 2021-01-07 ENCOUNTER — Encounter: Payer: BC Managed Care – PPO | Admitting: Family Medicine

## 2021-01-24 DIAGNOSIS — G4733 Obstructive sleep apnea (adult) (pediatric): Secondary | ICD-10-CM | POA: Diagnosis not present

## 2021-02-02 ENCOUNTER — Other Ambulatory Visit: Payer: Self-pay

## 2021-02-02 ENCOUNTER — Encounter: Payer: Self-pay | Admitting: Family Medicine

## 2021-02-02 ENCOUNTER — Other Ambulatory Visit (HOSPITAL_COMMUNITY)
Admission: RE | Admit: 2021-02-02 | Discharge: 2021-02-02 | Disposition: A | Discharge status: Home / Self Care | Payer: BC Managed Care – PPO | Source: Ambulatory Visit | Attending: Family Medicine | Admitting: Family Medicine

## 2021-02-02 ENCOUNTER — Ambulatory Visit (INDEPENDENT_AMBULATORY_CARE_PROVIDER_SITE_OTHER): Payer: BC Managed Care – PPO | Admitting: Family Medicine

## 2021-02-02 VITALS — BP 126/68 | HR 70 | Temp 98.0°F | Ht 65.0 in | Wt 147.0 lb

## 2021-02-02 DIAGNOSIS — Z0000 Encounter for general adult medical examination without abnormal findings: Secondary | ICD-10-CM

## 2021-02-02 DIAGNOSIS — R7989 Other specified abnormal findings of blood chemistry: Secondary | ICD-10-CM | POA: Diagnosis not present

## 2021-02-02 DIAGNOSIS — Z01419 Encounter for gynecological examination (general) (routine) without abnormal findings: Secondary | ICD-10-CM

## 2021-02-02 DIAGNOSIS — M810 Age-related osteoporosis without current pathological fracture: Secondary | ICD-10-CM

## 2021-02-02 DIAGNOSIS — Z1231 Encounter for screening mammogram for malignant neoplasm of breast: Secondary | ICD-10-CM

## 2021-02-02 DIAGNOSIS — E2839 Other primary ovarian failure: Secondary | ICD-10-CM

## 2021-02-02 NOTE — Progress Notes (Signed)
Subjective:    Patient ID: Heather Mcdonald, female    DOB: December 25, 1955, 66 y.o.   MRN: [id number]  This visit occurred during the SARS-CoV-2 public health emergency.  Safety protocols were in place, including screening questions prior to the visit, additional usage of staff PPE, and extensive cleaning of exam room while observing appropriate contact time as indicated for disinfecting solutions.   HPI Here for health maintenance exam and to review chronic medical problems    Wt Readings from Last 3 Encounters:  02/02/21 147 lb (66.7 kg)  11/14/19 143 lb 6 oz (65 kg)  09/14/19 143 lb 3.2 oz (65 kg)   24.46 kg/m  Went to [place] today  Thought bp would be up   Feeling stressed Built a new house -took a lot longer  Tried to work at home/did not work out  Daughter is getting married   Had covid /feeling better  Cough for 6 weeks    Pap 2018 -normal with neg HPV No gyn problems  Doing ok  Sexually active    Mammogram 12/2019 -had to re schedule it for covid - [facility] the [facility]  MGM with breast cancer  Self breast exam- no lumps   Colonoscopy 03/2018 -10 y recall  Mother had colon cancer at 39 yo More gas with age- better after bms  Eats well -tries to eat fiber  Certain foods make it worse    Shingrix-utd Flu shot-this October  Tdap 01/2012 Covid immunized and had covid   Dermatology care - has basal cell on R temple-planning surgery   Dexa 12/2018 Takes alendronate for 3 y Ca and D No falls or fracture  History of low vit D  (45.1 a year ago)  Walking  for exercise   BP Readings from Last 3 Encounters:  02/02/21 126/68  11/14/19 122/74  09/14/19 138/72   Pulse Readings from Last 3 Encounters:  02/02/21 70  11/14/19 74  09/14/19 70   Cr is 1.06 Drinks a lot of water   LDL 99 HDL 63  Good cholesterol profile   A1c of 5.4   TSH normal  Notes hair is thinning   Patient Active Problem List   Diagnosis Date Noted   Low vitamin D  level 11/14/2019   Screening mammogram, encounter for 10/02/2018   Family history of colon cancer mother at 50 03/01/2016   Estrogen deficiency 03/01/2016   Osteoporosis 03/01/2016   Aerophagia 07/27/2013   OSA (obstructive sleep apnea) 02/19/2013   Routine general medical examination at a health care facility 02/18/2012   Encounter for routine gynecological examination 02/18/2012   ALLERGIC RHINITIS 12/22/2006   FIBROCYSTIC BREAST DISEASE 12/22/2006   Past Medical History:  Diagnosis Date   Allergic rhinitis    Allergy    Anxiety disorder    Basal cell carcinoma    Bronchitis 24+yrs ago   Fibrocystic breast disease    Foot fracture    Gallbladder dyskinesia 01/28/2011   Gestational diabetes    History of uterine fibroid    Joint pain    OSA (obstructive sleep apnea) 02/19/2013   Osteoporosis    Papanicolaou smear of cervix with atypical squamous cells of undetermined significance (ASC-US)    RUQ pain    Sleep apnea    On CPAP   Temporomandibular joint disorder (TMJ)    Past Surgical History:  Procedure Laterality Date   basal cell removal     from face   BREAST CYST ASPIRATION Left  9/11 and 2/12   benign   cervical cryo  1996   CHOLECYSTECTOMY  03/02/2011   Procedure: LAPAROSCOPIC CHOLECYSTECTOMY;  Surgeon: Haywood Lasso, MD;  Location: [facility];  Service: General;  Laterality: N/A;  attempted intraoperative cholangiogram   COLONOSCOPY  04/08/2008   normal   LIPOMA EXCISION     from back   Social History   Tobacco Use   Smoking status: Never   Smokeless tobacco: Never  Vaping Use   Vaping Use: Never used  Substance Use Topics   Alcohol use: Yes    Alcohol/week: 0.0 standard drinks    Comment: socially-couple of times a week   Drug use: No   Family History  Problem Relation Age of Onset   Diabetes type II Father    Hypertension Father    Bladder Cancer Father    Hypertension Mother    Colon polyps Mother    Anesthesia problems Mother    Colon cancer  Mother 58   Breast cancer Maternal Grandmother        ? female cancer   Heart disease Maternal Grandfather    Diabetes Paternal Grandmother    Stroke Paternal Grandmother 11   Heart disease Paternal Grandfather    Hypotension Neg Hx    Malignant hyperthermia Neg Hx    Pseudochol deficiency Neg Hx    Esophageal cancer Neg Hx    Rectal cancer Neg Hx    Stomach cancer Neg Hx    Allergies  Allergen Reactions   Penicillins     REACTION: rash   Current Outpatient Medications on File Prior to Visit  Medication Sig Dispense Refill   alendronate (FOSAMAX) 70 MG tablet TAKE 1 TABLET EVERY 7 DAYS WITH A FULL GLASS OF WATER ON AN EMPTY STOMACH DO NOT LIE DOWN FOR AT LEAST 30 MINUTES. 12 tablet 0   Calcium Carb-Cholecalciferol (CALCIUM/VITAMIN D PO) Take 1 tablet by mouth 2 (two) times daily.     Multiple Vitamins-Minerals (ICAPS AREDS 2 PO) Take 1 capsule by mouth in the morning and at bedtime.     No current facility-administered medications on file prior to visit.    Review of Systems  Constitutional:  Negative for activity change, appetite change, fatigue, fever and unexpected weight change.  HENT:  Negative for congestion, ear pain, rhinorrhea, sinus pressure and sore throat.   Eyes:  Negative for pain, redness and visual disturbance.  Respiratory:  Negative for cough, shortness of breath and wheezing.   Cardiovascular:  Negative for chest pain and palpitations.  Gastrointestinal:  Negative for abdominal pain, blood in stool, constipation and diarrhea.  Endocrine: Negative for polydipsia and polyuria.  Genitourinary:  Negative for dysuria, frequency and urgency.  Musculoskeletal:  Negative for arthralgias, back pain and myalgias.  Skin:  Negative for pallor and rash.       Basal cell lesion on face, planning removal with dermatology  Allergic/Immunologic: Negative for environmental allergies.  Neurological:  Negative for dizziness, syncope and headaches.  Hematological:  Negative for  adenopathy. Does not bruise/bleed easily.  Psychiatric/Behavioral:  Negative for decreased concentration and dysphoric mood. The patient is not nervous/anxious.        Stressors       Objective:   Physical Exam Constitutional:      General: She is not in acute distress.    Appearance: Normal appearance. She is well-developed and normal weight. She is not ill-appearing or diaphoretic.  HENT:     Head: Normocephalic and atraumatic.  Right Ear: Tympanic membrane, ear canal and external ear normal.     Left Ear: Tympanic membrane, ear canal and external ear normal.     Nose: Nose normal. No congestion.     Mouth/Throat:     Mouth: Mucous membranes are moist.     Pharynx: Oropharynx is clear. No posterior oropharyngeal erythema.  Eyes:     General: No scleral icterus.    Extraocular Movements: Extraocular movements intact.     Conjunctiva/sclera: Conjunctivae normal.     Pupils: Pupils are equal, round, and reactive to light.  Neck:     Thyroid: No thyromegaly.     Vascular: No carotid bruit or JVD.  Cardiovascular:     Rate and Rhythm: Normal rate and regular rhythm.     Pulses: Normal pulses.     Heart sounds: Normal heart sounds.    No gallop.  Pulmonary:     Effort: Pulmonary effort is normal. No respiratory distress.     Breath sounds: Normal breath sounds. No wheezing.     Comments: Good air exch Chest:     Chest wall: No tenderness.  Abdominal:     General: Bowel sounds are normal. There is no distension or abdominal bruit.     Palpations: Abdomen is soft. There is no mass.     Tenderness: There is no abdominal tenderness.     Hernia: No hernia is present.  Genitourinary:    Comments: Breast exam: No mass, nodules, thickening, tenderness, bulging, retraction, inflamation, nipple discharge or skin changes noted.  No axillary or clavicular LA.     Uniformly dense breast tissue Musculoskeletal:        General: No tenderness. Normal range of motion.     Cervical back:  Normal range of motion and neck supple. No rigidity. No muscular tenderness.     Right lower leg: No edema.     Left lower leg: No edema.     Comments: No kyphosis   Lymphadenopathy:     Cervical: No cervical adenopathy.  Skin:    General: Skin is warm and dry.     Coloration: Skin is not pale.     Findings: No erythema or rash.     Comments: Solar lentigines diffusely   Neurological:     Mental Status: She is alert. Mental status is at baseline.     Cranial Nerves: No cranial nerve deficit.     Motor: No abnormal muscle tone.     Coordination: Coordination normal.     Gait: Gait normal.     Deep Tendon Reflexes: Reflexes are normal and symmetric. Reflexes normal.  Psychiatric:        Mood and Affect: Mood normal.        Cognition and Memory: Cognition and memory normal.          Assessment & Plan:   Problem List Items Addressed This Visit       Musculoskeletal and Integument   Osteoporosis    On 3rd year of alendronate  Will continue to watch renal function  Takes ca and D (D level added to lab today) Yoga/walking  No falls or fractures  Due for 2 y dexa -[facility]         Other   Routine general medical examination at a health care facility - Primary    Reviewed health habits including diet and exercise and skin cancer prevention Reviewed appropriate screening tests for age  Also reviewed health  mt list, fam hx and immunization status , as well as social and family history   See HPI Labs reviewed (pt brought from work) Gyn exam and pap done today  Mammogram ordered, pt will schedule  dexa ordered-pt will schedule  Colonoscopy is up to date  utd imms  utd dermatology care  Good exercise, supplementing ca /D for bone health      Encounter for routine gynecological examination    Routine exam  Pap done  No c/o or abn on exam  Mammogram ordered, pt will schedule      Relevant Orders   Cytology - PAP([place])   Estrogen  deficiency   Relevant Orders   DG Bone Density   Screening mammogram, encounter for    Nl breast exam (dense)  Mammogram ordered at the breast center, pt will schedule      Low vitamin D level    In setting of OP Supplementing  Level added to labs today  Disc importance to bone and overall health

## 2021-02-02 NOTE — Assessment & Plan Note (Signed)
Routine exam  Pap done  No c/o or abn on exam  Mammogram ordered, pt will schedule

## 2021-02-02 NOTE — Assessment & Plan Note (Signed)
In setting of OP Supplementing  Level added to labs today  Disc importance to bone and overall health

## 2021-02-02 NOTE — Assessment & Plan Note (Signed)
On 3rd year of alendronate  Will continue to watch renal function  Takes ca and D (D level added to lab today) Yoga/walking  No falls or fractures  Due for 2 y dexa -ordered [facility]

## 2021-02-02 NOTE — Assessment & Plan Note (Signed)
Nl breast exam (dense)  Mammogram ordered at the breast center, pt will schedule

## 2021-02-02 NOTE — Patient Instructions (Addendum)
Schedule your mammogram at the breast center  Also bone density test   Drink lots of fluids Don't over use nsaids (advil or aleve)   Eat healthy  Continue walking and doing yoga   Pap done today  Labs look stable

## 2021-02-02 NOTE — Assessment & Plan Note (Signed)
Reviewed health habits including diet and exercise and skin cancer prevention Reviewed appropriate screening tests for age  Also reviewed health mt list, fam hx and immunization status , as well as social and family history   See HPI Labs reviewed (pt brought from work) Gyn exam and pap done today  Mammogram ordered, pt will schedule  dexa ordered-pt will schedule  Colonoscopy is up to date  utd imms  utd dermatology care  Good exercise, supplementing ca /D for bone health

## 2021-02-03 ENCOUNTER — Encounter: Payer: Self-pay | Admitting: Family Medicine

## 2021-02-03 ENCOUNTER — Other Ambulatory Visit: Payer: Self-pay | Admitting: Family Medicine

## 2021-02-03 DIAGNOSIS — Z1231 Encounter for screening mammogram for malignant neoplasm of breast: Secondary | ICD-10-CM

## 2021-02-06 LAB — CYTOLOGY - PAP
Comment: NEGATIVE
Diagnosis: UNDETERMINED — AB
High risk HPV: NEGATIVE

## 2021-02-18 DIAGNOSIS — C44319 Basal cell carcinoma of skin of other parts of face: Secondary | ICD-10-CM | POA: Diagnosis not present

## 2021-02-23 ENCOUNTER — Other Ambulatory Visit: Payer: Self-pay | Admitting: Family Medicine

## 2021-02-24 DIAGNOSIS — G4733 Obstructive sleep apnea (adult) (pediatric): Secondary | ICD-10-CM | POA: Diagnosis not present

## 2021-05-05 IMAGING — MG MM DIGITAL DIAGNOSTIC UNILAT*R* W/ TOMO W/ CAD
4 series · 4 of 12 positions shown · non-contrast
Comparison: 12/27/2018 and earlier

CLINICAL DATA: Patient returns after screening study for evaluation
of possible RIGHT breast asymmetry.

EXAM:
DIGITAL DIAGNOSTIC RIGHT MAMMOGRAM WITH CAD AND TOMO
ULTRASOUND RIGHT BREAST

[R MLO synth-2D]
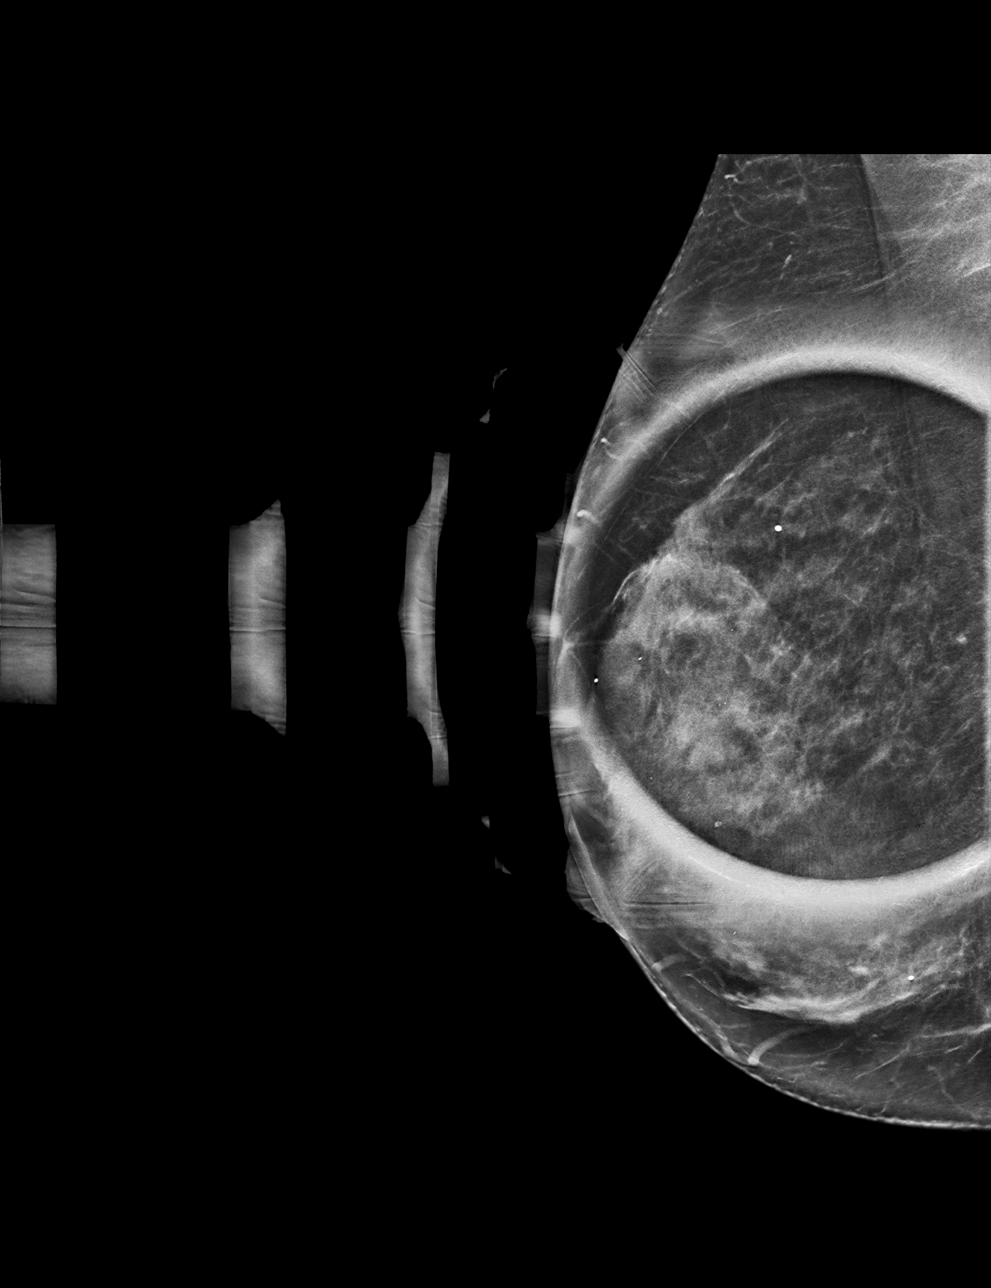

[R ML synth-2D]
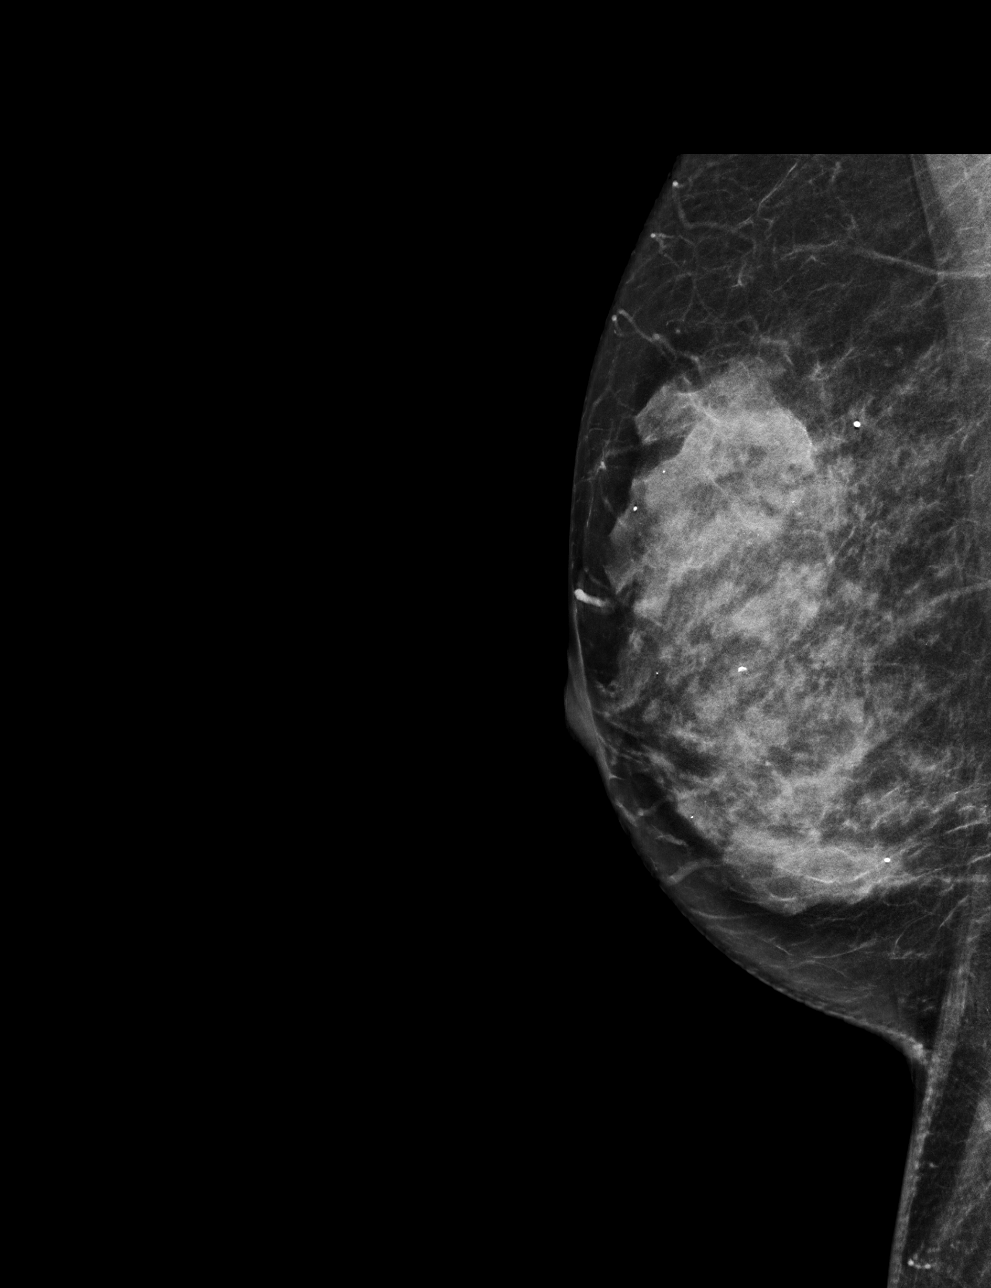

[R MLO tomo · tomo slice 31/61.0]
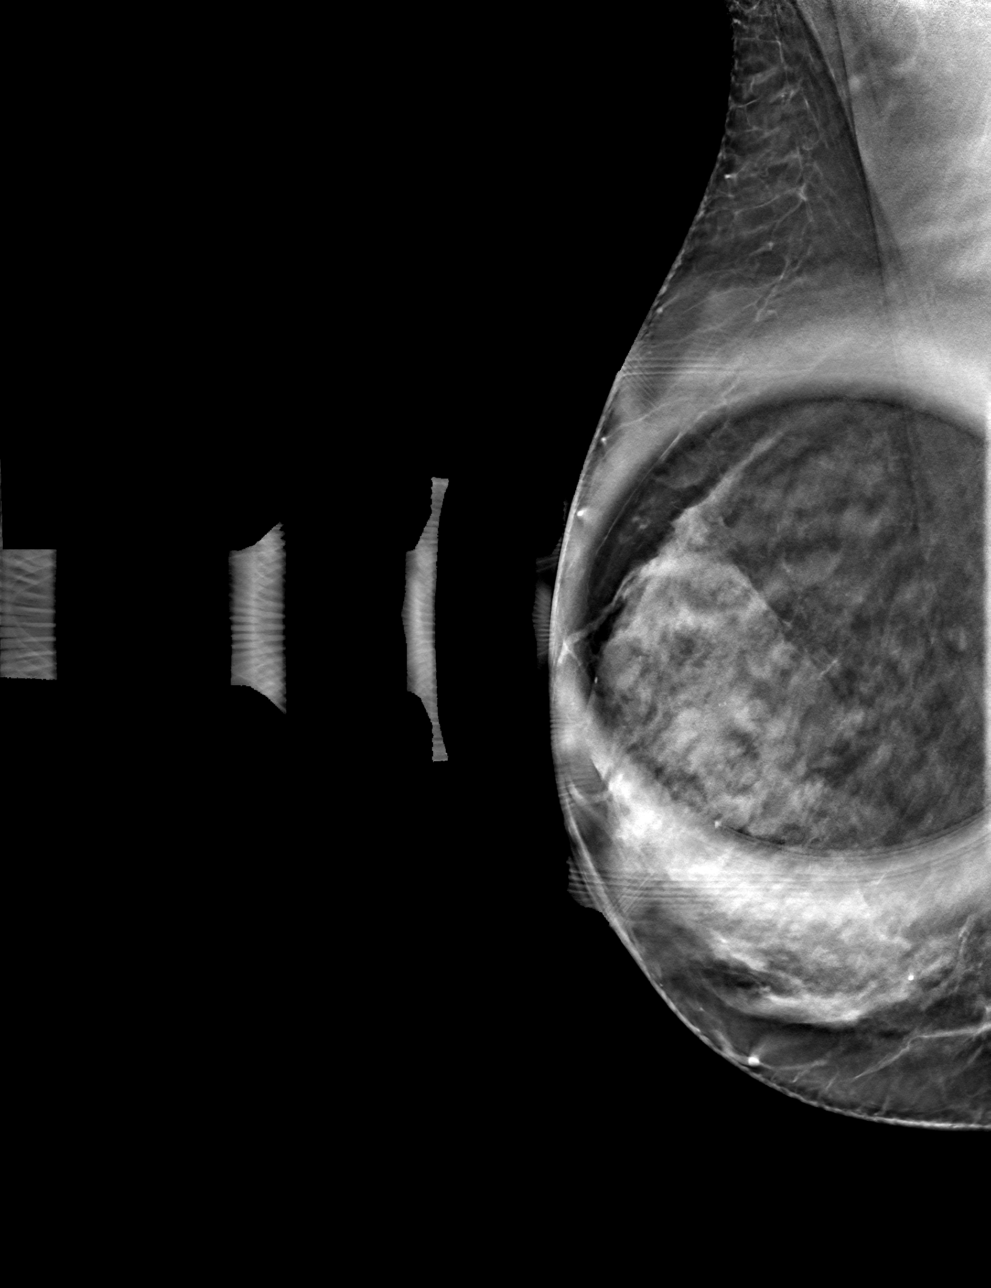

[R ML tomo · tomo slice 32/63.0]
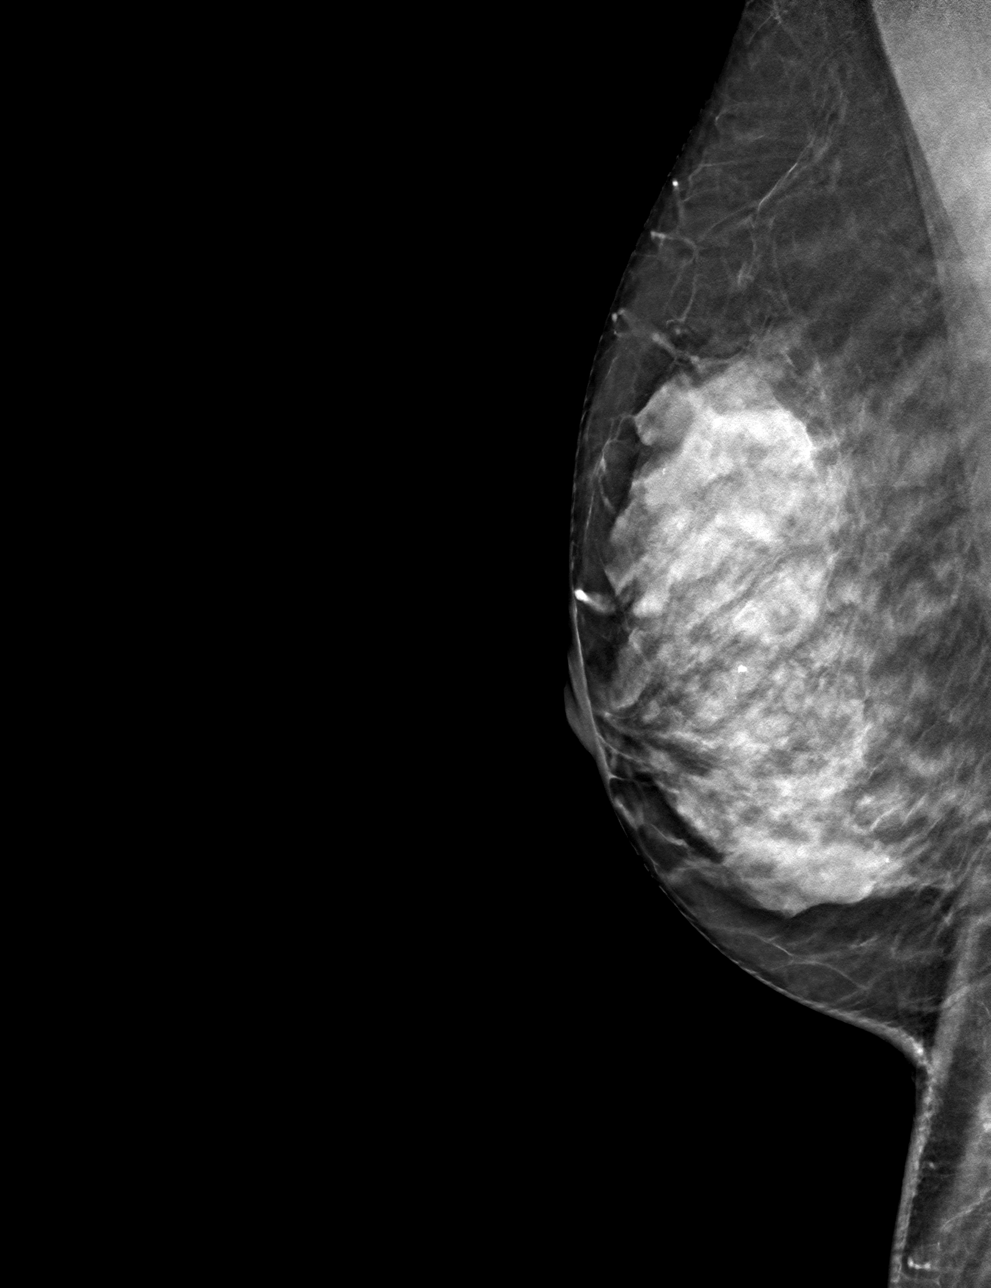

[4 of 12 positions shown; findings below may reference images not displayed]

ACR Breast Density Category c: The breast tissue is heterogeneously
dense, which may obscure small masses.
FINDINGS: Additional 2-D and 3-D images are performed. These views show dense
fibroglandular tissue in the superior portion of the RIGHT breast
without definitive mass or distortion.

Mammographic images were processed with CAD.

On physical exam, I palpate no abnormality in the UPPER portion of
the RIGHT breast.

Targeted ultrasound is performed, showing normal appearing common
dense fibroglandular tissue in the UPPER portions of the RIGHT
breast. Incidental note is made of a benign cyst in the 10 o'clock
location 4 centimeters from the nipple which measures 7 millimeters
in diameter.
IMPRESSION: No mammographic or ultrasound evidence for malignancy.

RECOMMENDATION:
Screening mammogram in one year.(Code:UH-[id number])

I have discussed the findings and recommendations with the patient.
If applicable, a reminder letter will be sent to the patient
regarding the next appointment.

BI-RADS CATEGORY  2: Benign.

## 2021-06-18 ENCOUNTER — Ambulatory Visit
Admission: RE | Admit: 2021-06-18 | Discharge: 2021-06-18 | Disposition: A | Discharge status: Home / Self Care | Payer: BC Managed Care – PPO | Source: Ambulatory Visit | Attending: Family Medicine | Admitting: Family Medicine

## 2021-06-18 DIAGNOSIS — Z780 Asymptomatic menopausal state: Secondary | ICD-10-CM | POA: Diagnosis not present

## 2021-06-18 DIAGNOSIS — M8589 Other specified disorders of bone density and structure, multiple sites: Secondary | ICD-10-CM | POA: Diagnosis not present

## 2021-06-18 DIAGNOSIS — Z1231 Encounter for screening mammogram for malignant neoplasm of breast: Secondary | ICD-10-CM | POA: Diagnosis not present

## 2021-06-18 DIAGNOSIS — E2839 Other primary ovarian failure: Secondary | ICD-10-CM

## 2021-07-15 DIAGNOSIS — D225 Melanocytic nevi of trunk: Secondary | ICD-10-CM | POA: Diagnosis not present

## 2021-07-15 DIAGNOSIS — D2261 Melanocytic nevi of right upper limb, including shoulder: Secondary | ICD-10-CM | POA: Diagnosis not present

## 2021-07-15 DIAGNOSIS — D2272 Melanocytic nevi of left lower limb, including hip: Secondary | ICD-10-CM | POA: Diagnosis not present

## 2021-07-15 DIAGNOSIS — Z85828 Personal history of other malignant neoplasm of skin: Secondary | ICD-10-CM | POA: Diagnosis not present

## 2021-07-15 DIAGNOSIS — L570 Actinic keratosis: Secondary | ICD-10-CM | POA: Diagnosis not present

## 2021-08-17 ENCOUNTER — Other Ambulatory Visit: Payer: Self-pay | Admitting: Family Medicine

## 2021-10-09 DIAGNOSIS — G4733 Obstructive sleep apnea (adult) (pediatric): Secondary | ICD-10-CM | POA: Diagnosis not present

## 2021-11-08 DIAGNOSIS — G4733 Obstructive sleep apnea (adult) (pediatric): Secondary | ICD-10-CM | POA: Diagnosis not present

## 2021-12-09 DIAGNOSIS — G4733 Obstructive sleep apnea (adult) (pediatric): Secondary | ICD-10-CM | POA: Diagnosis not present

## 2021-12-21 ENCOUNTER — Ambulatory Visit (INDEPENDENT_AMBULATORY_CARE_PROVIDER_SITE_OTHER): Payer: BC Managed Care – PPO | Admitting: Pulmonary Disease

## 2021-12-21 ENCOUNTER — Encounter (HOSPITAL_BASED_OUTPATIENT_CLINIC_OR_DEPARTMENT_OTHER): Payer: Self-pay | Admitting: Pulmonary Disease

## 2021-12-21 VITALS — BP 120/74 | HR 71 | Temp 98.7°F | Ht 65.0 in | Wt 150.0 lb

## 2021-12-21 DIAGNOSIS — G4733 Obstructive sleep apnea (adult) (pediatric): Secondary | ICD-10-CM

## 2021-12-21 NOTE — Patient Instructions (Signed)
Follow up in 1 year.

## 2021-12-21 NOTE — Progress Notes (Signed)
[facility]  Chief Complaint  Patient presents with   Follow-up    No new issues since LOV.    Constitutional:  BP 120/74 (BP Location: Left Arm, Patient Position: Sitting, Cuff Size: Normal)   Pulse 71   Temp 98.7 F (37.1 C) (Oral)   Ht '5\' 5"'$  (1.651 m)   Wt 150 lb (68 kg)   LMP 01/19/2010   SpO2 99%   BMI 24.96 kg/m   Past Medical History:  TMJ, Osteoporosis, Allergies, Anxiety  Past Surgical History:  Her  has a past surgical history that includes Lipoma excision; cervical cryo (1996); basal cell removal; Colonoscopy (04/08/2008); Breast cyst aspiration (Left, 9/11 and 2/12); and Cholecystectomy (03/02/2011).  Brief Summary:  Heather Mcdonald is a 66 y.o. female with obstructive sleep apnea.      Subjective:   She is doing well with Bipap.  She has full face mask.  No issue with mask fit.  She keeps her new machine at her home in [place].  She has her old machine at her apartment in [place] that she uses for work.  Physical Exam:   Appearance - well kempt   ENMT - no sinus tenderness, no oral exudate, no LAN, Mallampati 3 airway, no stridor  Respiratory - equal breath sounds bilaterally, no wheezing or rales  CV - s1s2 regular rate and rhythm, no murmurs  Ext - no clubbing, no edema  Skin - no rashes  Psych - normal mood and affect   Sleep Tests:  PSG 04/16/13 >> AHI of 23.2, SaO2 low of 87%.  Unable to tolerate CPAP.  BiPAP 9/5 >> AHI 0. BiPAP 11/21/21 to 12/20/21 >> used on 18 of 30 nights with average 6 hrs 33 min.  Average AHI 0.7 with Bipap 9/5 cm H2O  Social History:  She  reports that she has never smoked. She has never used smokeless tobacco. She reports current alcohol use. She reports that she does not use drugs.  Family History:  Her family history includes Anesthesia problems in her mother; Bladder Cancer in her father; Breast cancer in her maternal grandmother; Colon cancer (age of onset: 19)  in her mother; Colon polyps in her mother; Diabetes in her paternal grandmother; Diabetes type II in her father; Heart disease in her maternal grandfather and paternal grandfather; Hypertension in her father and mother; Stroke (age of onset: 58) in her paternal grandmother.     Assessment/Plan:   Obstructive sleep apnea. - she is compliant with Bipap and reports benefit - she uses [employer] for her DME - current Bipap ordered June 2021 - she keeps her new Bipap at home in [place], and her old Bipap at her apartment in [place] - continue [place] 9/5 cm H2O  Time Spent Involved in Patient Care on Day of Examination:  16 minutes  Follow up:   Patient Instructions  Follow up in 1 year  Medication List:   Allergies as of 12/21/2021       Reactions   Penicillins    REACTION: rash        Medication List        Accurate as of December 21, 2021  4:19 PM. If you have any questions, ask your nurse or doctor.          alendronate 70 MG tablet Commonly known as: FOSAMAX TAKE 1 TABLET EVERY 7 DAYS WITH A FULL GLASS OF WATER ON AN EMPTY STOMACH DO NOT LIE DOWN  FOR AT LEAST 30 MINUTES.   CALCIUM/VITAMIN D PO Take 1 tablet by mouth 2 (two) times daily.   ICAPS AREDS 2 PO Take 1 capsule by mouth in the morning and at bedtime.        Signature:  Chesley Mires, MD [facility] Pager - [phone] 12/21/2021, 4:19 PM

## 2022-01-26 ENCOUNTER — Other Ambulatory Visit: Payer: Self-pay | Admitting: Family Medicine

## 2022-01-27 NOTE — Telephone Encounter (Signed)
Pt is due for her CPE on or after 02/03/22, please schedule and then route back to me to refill, thanks

## 2022-01-27 NOTE — Telephone Encounter (Signed)
Pt advised to bring labs from job and if we need to do labs the day of her CPE we will

## 2022-01-27 NOTE — Telephone Encounter (Signed)
Patient scheduled for cpe.She stated that she had labs done in November at her job,and would like to know can she she just bring in those results instead of having more labs done for her cpe? Please advise.

## 2022-02-09 ENCOUNTER — Ambulatory Visit (INDEPENDENT_AMBULATORY_CARE_PROVIDER_SITE_OTHER): Payer: BC Managed Care – PPO | Admitting: Family Medicine

## 2022-02-09 ENCOUNTER — Encounter: Payer: Self-pay | Admitting: Family Medicine

## 2022-02-09 VITALS — BP 135/70 | HR 82 | Temp 98.1°F | Ht 65.0 in | Wt 148.4 lb

## 2022-02-09 DIAGNOSIS — Z0000 Encounter for general adult medical examination without abnormal findings: Secondary | ICD-10-CM | POA: Diagnosis not present

## 2022-02-09 DIAGNOSIS — I10 Essential (primary) hypertension: Secondary | ICD-10-CM | POA: Insufficient documentation

## 2022-02-09 DIAGNOSIS — M810 Age-related osteoporosis without current pathological fracture: Secondary | ICD-10-CM

## 2022-02-09 DIAGNOSIS — R87610 Atypical squamous cells of undetermined significance on cytologic smear of cervix (ASC-US): Secondary | ICD-10-CM

## 2022-02-09 DIAGNOSIS — R7989 Other specified abnormal findings of blood chemistry: Secondary | ICD-10-CM

## 2022-02-09 MED ORDER — LISINOPRIL 20 MG PO TABS
20.0000 mg | ORAL_TABLET | Freq: Every day | ORAL | 3 refills | Status: AC
Start: 2022-02-09 — End: ?

## 2022-02-09 NOTE — Assessment & Plan Note (Signed)
Dexa 06/2021 reviwed No falls or fx Will finish 5 y course of alendronate in sept   Enc ca and D and exercise /strength training

## 2022-02-09 NOTE — Assessment & Plan Note (Signed)
Reviewed health habits including diet and exercise and skin cancer prevention Reviewed appropriate screening tests for age  Also reviewed health mt list, fam hx and immunization status , as well as social and family history   See HPI Rev labs from outside office Mammogram utd 06/2021 Pap a year ago ascus with neg hpv / low risk Colonoscopy 03/2018 wit h10 y recall/ Dr Carlean Purl  Dexa utd 06/2021  no falls or fx

## 2022-02-09 NOTE — Patient Instructions (Addendum)
You can stop the 5 year course of alendronate in September   You need a a re check of bmet (kidney function)  and cbc  since starting on lisinopril whenever you can   Take care of yourself  Keep up the good work with exercise  Eat a healthy diet  Try to get most of your carbohydrates from produce (with the exception of white potatoes)  Eat less bread/pasta/rice/snack foods/cereals/sweets and other items from the middle of the grocery store (processed carbs)

## 2022-02-09 NOTE — Assessment & Plan Note (Signed)
Last pap 1 y ago with above No symptoms or new partners Will re check at 43 y mark unless clinical change

## 2022-02-09 NOTE — Assessment & Plan Note (Signed)
New dx since last visit bp in fair control at this time  BP Readings from Last 1 Encounters:  02/09/22 135/70   No changes needed Most recent labs reviewed  Disc lifstyle change with low sodium diet and exercise  Plan to continue lisinopril 20 mg daily  Will get bmet / cbc soon from work and send it to [place]

## 2022-02-09 NOTE — Progress Notes (Signed)
Subjective:    Patient ID: Heather Mcdonald, female    DOB: September 30, 1955, 67 y.o.   MRN: [id number]  HPI Here for health maintenance exam and to review chronic medical problems    Wt Readings from Last 3 Encounters:  02/09/22 148 lb 6 oz (67.3 kg)  12/21/21 150 lb (68 kg)  02/02/21 147 lb (66.7 kg)   24.69 kg/m Feeling well overall   Immunization History  Administered Date(s) Administered   H1N1 02/21/2008   Influenza Split 11/13/2012, 09/19/2015, 10/28/2016, 10/28/2017   Influenza Whole 10/28/2006   Influenza,inj,Quad PF,6+ Mos 10/18/2013   Influenza-Unspecified 10/03/2014, 10/22/2019, 10/20/2020   PFIZER(Purple Top)SARS-COV-2 Vaccination 03/23/2019, 04/18/2019   Td 01/18/2002   Tdap 02/18/2012   Zoster Recombinat (Shingrix) 11/17/2017, 02/06/2018, 06/20/2018   Health Maintenance Due  Topic Date Due   HIV Screening  Never done   Hepatitis C Screening  Never done   COVID-19 Vaccine (3 - Pfizer risk series) 05/16/2019   INFLUENZA VACCINE  08/18/2021   Flu shot-utd   Mammogram 06/2021 Self breast exam: no lumps   Pap 01/2021  Ascus with neg HPV No gyn symptoms or problems  Tends to have vaginal dryness    Colonoscopy 03/2018 [facility] had colon cancer at 36 10 y recall   Dexa  06/2021 On year 4 of alendronate  09/2017   Falls: none Fractures: none Supplements  d and Ca Exercise : walk and yoga (loves it )   Labs from oct reviewed today  Will be scanned   HTN Dx since last visit  She monitors her bp   was 145-155/80s  Was stressed also  Father had HTN , mother has HTN  bp is stable today  No cp or palpitations or headaches or edema  No side effects to medicines  BP Readings from Last 3 Encounters:  02/09/22 135/70  12/21/21 120/74  02/02/21 126/68    She was tx by NP at work with lisinopril 20 mg  Labs are from before   GFR 63   Labs from October at [facility] panel  Cholesterol  LDL of 103 Hdl of 65  Trig 119   Lab Results  Component  Value Date   CHOL 180 11/13/2020   HDL 63 11/13/2020   LDLCALC 99 11/13/2020   TRIG 98 11/13/2020   Tsh nl at 1.56  A1c 5.6   Good diet overall   Patient Active Problem List   Diagnosis Date Noted   Benign essential HTN 02/09/2022   Low vitamin D level 11/14/2019   Screening mammogram, encounter for 10/02/2018   Family history of colon cancer mother at 76 03/01/2016   Estrogen deficiency 03/01/2016   Osteoporosis 03/01/2016   Aerophagia 07/27/2013   OSA (obstructive sleep apnea) 02/19/2013   Routine general medical examination at a health care facility 02/18/2012   Encounter for routine gynecological examination 02/18/2012   ALLERGIC RHINITIS 12/22/2006   FIBROCYSTIC BREAST DISEASE 12/22/2006   Past Medical History:  Diagnosis Date   Allergic rhinitis    Allergy    Anxiety disorder    Basal cell carcinoma    Bronchitis 24+yrs ago   Fibrocystic breast disease    Foot fracture    Gallbladder dyskinesia 01/28/2011   Gestational diabetes    History of uterine fibroid    Joint pain    OSA (obstructive sleep apnea) 02/19/2013   Osteoporosis    Papanicolaou smear of cervix with atypical squamous cells of undetermined significance (ASC-US)    RUQ  pain    Sleep apnea    On CPAP   Temporomandibular joint disorder (TMJ)    Past Surgical History:  Procedure Laterality Date   basal cell removal     from face   BREAST CYST ASPIRATION Left 9/11 and 2/12   benign   cervical cryo  1996   CHOLECYSTECTOMY  03/02/2011   Procedure: LAPAROSCOPIC CHOLECYSTECTOMY;  Surgeon: Haywood Lasso, MD;  Location: [facility];  Service: General;  Laterality: N/A;  attempted intraoperative cholangiogram   COLONOSCOPY  04/08/2008   normal   LIPOMA EXCISION     from back   Social History   Tobacco Use   Smoking status: Never   Smokeless tobacco: Never  Vaping Use   Vaping Use: Never used  Substance Use Topics   Alcohol use: Yes    Alcohol/week: 0.0 standard drinks of alcohol    Comment:  socially-couple of times a week   Drug use: No   Family History  Problem Relation Age of Onset   Diabetes type II Father    Hypertension Father    Bladder Cancer Father    Hypertension Mother    Colon polyps Mother    Anesthesia problems Mother    Colon cancer Mother 52   Breast cancer Maternal Grandmother        ? female cancer   Heart disease Maternal Grandfather    Diabetes Paternal Grandmother    Stroke Paternal Grandmother 5   Heart disease Paternal Grandfather    Hypotension Neg Hx    Malignant hyperthermia Neg Hx    Pseudochol deficiency Neg Hx    Esophageal cancer Neg Hx    Rectal cancer Neg Hx    Stomach cancer Neg Hx    Allergies  Allergen Reactions   Penicillins     REACTION: rash   Current Outpatient Medications on File Prior to Visit  Medication Sig Dispense Refill   alendronate (FOSAMAX) 70 MG tablet TAKE 1 TABLET EVERY 7 DAYS WITH A FULL GLASS OF WATER ON AN EMPTY STOMACH DO NOT LIE DOWN FOR AT LEAST 30 MINUTES. 12 tablet 0   Calcium Carb-Cholecalciferol (CALCIUM/VITAMIN D PO) Take 1 tablet by mouth 2 (two) times daily.     Multiple Vitamins-Minerals (ICAPS AREDS 2 PO) Take 1 capsule by mouth in the morning and at bedtime.     No current facility-administered medications on file prior to visit.    Review of Systems  Constitutional:  Negative for activity change, appetite change, fatigue, fever and unexpected weight change.  HENT:  Negative for congestion, ear pain, rhinorrhea, sinus pressure and sore throat.   Eyes:  Negative for pain, redness and visual disturbance.  Respiratory:  Negative for cough, shortness of breath and wheezing.   Cardiovascular:  Negative for chest pain and palpitations.  Gastrointestinal:  Negative for abdominal pain, blood in stool, constipation and diarrhea.  Endocrine: Negative for polydipsia and polyuria.  Genitourinary:  Negative for dysuria, frequency and urgency.  Musculoskeletal:  Negative for arthralgias, back pain and  myalgias.  Skin:  Negative for pallor and rash.  Allergic/Immunologic: Negative for environmental allergies.  Neurological:  Negative for dizziness, syncope and headaches.  Hematological:  Negative for adenopathy. Does not bruise/bleed easily.  Psychiatric/Behavioral:  Negative for decreased concentration and dysphoric mood. The patient is not nervous/anxious.        Objective:   Physical Exam Constitutional:      General: She is not in acute distress.    Appearance: Normal appearance. She  is well-developed and normal weight. She is not ill-appearing or diaphoretic.  HENT:     Head: Normocephalic and atraumatic.     Right Ear: Tympanic membrane, ear canal and external ear normal.     Left Ear: Tympanic membrane, ear canal and external ear normal.     Nose: Nose normal. No congestion.     Mouth/Throat:     Mouth: Mucous membranes are moist.     Pharynx: Oropharynx is clear. No posterior oropharyngeal erythema.  Eyes:     General: No scleral icterus.    Extraocular Movements: Extraocular movements intact.     Conjunctiva/sclera: Conjunctivae normal.     Pupils: Pupils are equal, round, and reactive to light.  Neck:     Thyroid: No thyromegaly.     Vascular: No carotid bruit or JVD.  Cardiovascular:     Rate and Rhythm: Normal rate and regular rhythm.     Pulses: Normal pulses.     Heart sounds: Normal heart sounds.     No gallop.  Pulmonary:     Effort: Pulmonary effort is normal. No respiratory distress.     Breath sounds: Normal breath sounds. No wheezing.     Comments: Good air exch Chest:     Chest wall: No tenderness.  Abdominal:     General: Bowel sounds are normal. There is no distension or abdominal bruit.     Palpations: Abdomen is soft. There is no mass.     Tenderness: There is no abdominal tenderness.     Hernia: No hernia is present.  Genitourinary:    Comments: Breast exam: No mass, nodules, thickening, tenderness, bulging, retraction, inflamation, nipple  discharge or skin changes noted.  No axillary or clavicular LA.     Musculoskeletal:        General: No tenderness. Normal range of motion.     Cervical back: Normal range of motion and neck supple. No rigidity. No muscular tenderness.     Right lower leg: No edema.     Left lower leg: No edema.     Comments: No kyphosis   Lymphadenopathy:     Cervical: No cervical adenopathy.  Skin:    General: Skin is warm and dry.     Coloration: Skin is not pale.     Findings: No erythema or rash.     Comments: Solar lentigines diffusely   Neurological:     Mental Status: She is alert. Mental status is at baseline.     Cranial Nerves: No cranial nerve deficit.     Motor: No abnormal muscle tone.     Coordination: Coordination normal.     Gait: Gait normal.     Deep Tendon Reflexes: Reflexes are normal and symmetric. Reflexes normal.  Psychiatric:        Mood and Affect: Mood normal.        Cognition and Memory: Cognition and memory normal.           Assessment & Plan:   Problem List Items Addressed This Visit       Cardiovascular and Mediastinum   Benign essential HTN    New dx since last visit bp in fair control at this time  BP Readings from Last 1 Encounters:  02/09/22 135/70  No changes needed Most recent labs reviewed  Disc lifstyle change with low sodium diet and exercise  Plan to continue lisinopril 20 mg daily  Will get bmet / cbc soon from work and send it to [place]  Relevant Medications   lisinopril (ZESTRIL) 20 MG tablet     Musculoskeletal and Integument   Osteoporosis    Dexa 06/2021 reviwed No falls or fx Will finish 5 y course of alendronate in sept   Enc ca and D and exercise /strength training         Other   ASCUS of cervix with negative high risk HPV    Last pap 1 y ago with above No symptoms or new partners Will re check at 17 y mark unless clinical change       Low vitamin D level    Enc her to continue current supplements for bone and  overall health      Routine general medical examination at a health care facility - Primary    Reviewed health habits including diet and exercise and skin cancer prevention Reviewed appropriate screening tests for age  Also reviewed health mt list, fam hx and immunization status , as well as social and family history   See HPI Rev labs from outside office Mammogram utd 06/2021 Pap a year ago ascus with neg hpv / low risk Colonoscopy 03/2018 wit h10 y recall/ Dr Carlean Purl  Dexa utd 06/2021  no falls or fx

## 2022-02-09 NOTE — Assessment & Plan Note (Signed)
Enc her to continue current supplements for bone and overall health

## 2022-04-11 DIAGNOSIS — R202 Paresthesia of skin: Secondary | ICD-10-CM | POA: Diagnosis not present

## 2022-04-11 DIAGNOSIS — M79602 Pain in left arm: Secondary | ICD-10-CM | POA: Diagnosis not present

## 2022-04-11 DIAGNOSIS — R110 Nausea: Secondary | ICD-10-CM | POA: Diagnosis not present

## 2022-04-11 DIAGNOSIS — M79622 Pain in left upper arm: Secondary | ICD-10-CM | POA: Diagnosis not present

## 2022-04-11 DIAGNOSIS — I10 Essential (primary) hypertension: Secondary | ICD-10-CM | POA: Diagnosis not present

## 2022-04-11 DIAGNOSIS — R079 Chest pain, unspecified: Secondary | ICD-10-CM | POA: Diagnosis not present

## 2022-04-11 DIAGNOSIS — Z9049 Acquired absence of other specified parts of digestive tract: Secondary | ICD-10-CM | POA: Diagnosis not present

## 2022-04-11 DIAGNOSIS — I700 Atherosclerosis of aorta: Secondary | ICD-10-CM | POA: Diagnosis not present

## 2022-04-28 ENCOUNTER — Other Ambulatory Visit: Payer: Self-pay | Admitting: Family Medicine

## 2022-05-05 DIAGNOSIS — D0439 Carcinoma in situ of skin of other parts of face: Secondary | ICD-10-CM | POA: Diagnosis not present

## 2022-05-05 DIAGNOSIS — D225 Melanocytic nevi of trunk: Secondary | ICD-10-CM | POA: Diagnosis not present

## 2022-05-05 DIAGNOSIS — Z85828 Personal history of other malignant neoplasm of skin: Secondary | ICD-10-CM | POA: Diagnosis not present

## 2022-05-05 DIAGNOSIS — D2261 Melanocytic nevi of right upper limb, including shoulder: Secondary | ICD-10-CM | POA: Diagnosis not present

## 2022-05-05 DIAGNOSIS — D485 Neoplasm of uncertain behavior of skin: Secondary | ICD-10-CM | POA: Diagnosis not present

## 2022-05-05 DIAGNOSIS — D2262 Melanocytic nevi of left upper limb, including shoulder: Secondary | ICD-10-CM | POA: Diagnosis not present

## 2022-05-17 ENCOUNTER — Other Ambulatory Visit: Payer: Self-pay | Admitting: Family Medicine

## 2022-05-17 DIAGNOSIS — Z1231 Encounter for screening mammogram for malignant neoplasm of breast: Secondary | ICD-10-CM

## 2022-06-22 ENCOUNTER — Ambulatory Visit
Admission: RE | Admit: 2022-06-22 | Discharge: 2022-06-22 | Disposition: A | Discharge status: Home / Self Care | Payer: BC Managed Care – PPO | Source: Ambulatory Visit

## 2022-06-22 ENCOUNTER — Telehealth: Payer: Self-pay | Admitting: Family Medicine

## 2022-06-22 DIAGNOSIS — Z1231 Encounter for screening mammogram for malignant neoplasm of breast: Secondary | ICD-10-CM

## 2022-06-22 NOTE — Telephone Encounter (Signed)
Dexa is every other year Not due till 06/2023   (last was 06/2021)

## 2022-06-22 NOTE — Telephone Encounter (Signed)
Patient contacted the office regarding bone density scan. Stated she just went for her mammogram and was under the impression she was to have her bone density scan the same day. Patient says she in fact did not have that scan today, and thought she had discussed that with Dr[facility] Milinda Antis at [facility]. Patient was wanting to know if she was only supposed to have the mammogram or if she was supposed to have the bone density as well. Please advise, thank you.

## 2022-06-22 NOTE — Telephone Encounter (Signed)
Spoke to pt. She appreciated the follow-up

## 2022-09-16 DIAGNOSIS — G4733 Obstructive sleep apnea (adult) (pediatric): Secondary | ICD-10-CM | POA: Diagnosis not present

## 2022-10-05 ENCOUNTER — Encounter: Payer: Self-pay | Admitting: Family Medicine

## 2022-10-17 DIAGNOSIS — G4733 Obstructive sleep apnea (adult) (pediatric): Secondary | ICD-10-CM | POA: Diagnosis not present

## 2022-11-16 DIAGNOSIS — G4733 Obstructive sleep apnea (adult) (pediatric): Secondary | ICD-10-CM | POA: Diagnosis not present

## 2022-12-22 DIAGNOSIS — D225 Melanocytic nevi of trunk: Secondary | ICD-10-CM | POA: Diagnosis not present

## 2022-12-22 DIAGNOSIS — L570 Actinic keratosis: Secondary | ICD-10-CM | POA: Diagnosis not present

## 2022-12-22 DIAGNOSIS — D2262 Melanocytic nevi of left upper limb, including shoulder: Secondary | ICD-10-CM | POA: Diagnosis not present

## 2022-12-22 DIAGNOSIS — D2261 Melanocytic nevi of right upper limb, including shoulder: Secondary | ICD-10-CM | POA: Diagnosis not present
